# Patient Record
Sex: Female | Born: 1975 | Race: Black or African American | Hispanic: No | Marital: Married | Smoking: Never smoker
Health system: Southern US, Community
[De-identification: ages and names within clinical notes are randomized; demographics above are authoritative.]

## PROBLEM LIST (undated history)

## (undated) DIAGNOSIS — D573 Sickle-cell trait: Secondary | ICD-10-CM

## (undated) DIAGNOSIS — T7840XA Allergy, unspecified, initial encounter: Secondary | ICD-10-CM

## (undated) HISTORY — DX: Allergy, unspecified, initial encounter: T78.40XA

---

## 2001-05-18 ENCOUNTER — Inpatient Hospital Stay (HOSPITAL_COMMUNITY): Admission: AD | Admit: 2001-05-18 | Discharge: 2001-05-18 | Payer: Self-pay | Admitting: Obstetrics

## 2002-03-01 LAB — HM PAP SMEAR: HM Pap smear: NEGATIVE

## 2009-08-01 ENCOUNTER — Ambulatory Visit: Payer: Self-pay | Admitting: Family Medicine

## 2009-09-27 ENCOUNTER — Ambulatory Visit: Payer: Self-pay | Admitting: Obstetrics & Gynecology

## 2009-09-27 ENCOUNTER — Encounter: Admission: RE | Admit: 2009-09-27 | Discharge: 2009-09-27 | Payer: Self-pay | Admitting: Obstetrics & Gynecology

## 2011-01-15 NOTE — Assessment & Plan Note (Signed)
Jamie Lawson, WEYENBERG NO.:  [id number]   MEDICAL RECORD NO.:  [id number]          PATIENT TYPE:  POB   LOCATION:  [facility] at [place]         FACILITY:  [facility]   PHYSICIAN:  Elsie Lincoln, MD      DATE OF BIRTH:  06/20/1976   DATE OF SERVICE:                                  CLINIC NOTE   The patient is a 35 year old G4, P4 female who presents for a 19-month  history of pain in the left breast.  She has been breastfeeding her baby  for 15 months.  She has breastfed all of her kids for 2 years.  She is  worried that she has cancer.  She denies any fever or redness of the  left breast.  The baby goes to breasts approximately 5 times a day and  feeds well.  The patient denies any family history of breast cancer.   PAST MEDICAL HISTORY:  Denies all medical problems.   PAST SURGICAL HISTORY:  C-section x4.   GYNECOLOGIC HISTORY:  No history of abnormal Pap smear, has had a tubal  ligation.  No history of ovarian cysts, fibroid tumors, sexually  transmitted diseases or endometriosis.   MEDICATIONS:  None.   ALLERGIES:  None.  Latex allergy none.   SOCIAL HISTORY:  She is a Conservation officer, nature.  She is from [place].  She does not  smoke, drink or do illegal drugs.   FAMILY HISTORY:  Denies all familial cancers.  No diabetes, heart  disease, heart attack, high blood pressure, blood clots in legs or  lungs.   REVIEW OF SYSTEMS:  Positive for weakness, fatigue, anxiety, shortness  of breath and leg cramps.   PHYSICAL EXAMINATION:  VITAL SIGNS:  Pulse 72, blood pressure 106/61,  weight 178, height 66 inches.  GENERAL:  Well nourished, well developed, no apparent distress.  HEENT:  Normocephalic, atraumatic.  NEUROLOGIC:  Normal gait.  BREASTS:  Left breast greater than right breast.  The patient states  this has been this way her whole life.  No changes in the way the breast  hangs.  No skin changes.  Left breast tender in the nipple with deep  palpation.  No milk  expressed from either breast.  The breasts are full  and lumpy, bumpy, difficult to examine, but there are no discrete fixed  masses, no lymphadenopathy.   ASSESSMENT AND PLAN:  A 35 year old female with breast pain behind the  left nipple for 10 months.  1. Diagnostic mammogram and ultrasound.  2. The patient needs yearly exam and we will obtain records from      [facility] from her last      delivery.  3. Return to clinic in 1-2 weeks for test results and yearly exam.           ______________________________  Elsie Lincoln, MD     KL/MEDQ  D:  09/27/2009  T:  09/28/2009  Job:  [id number]

## 2017-11-27 ENCOUNTER — Encounter: Payer: Self-pay | Admitting: Emergency Medicine

## 2017-11-27 ENCOUNTER — Emergency Department
Admission: EM | Admit: 2017-11-27 | Discharge: 2017-11-27 | Disposition: A | Discharge status: Home / Self Care | Payer: 59 | Source: Home / Self Care | Attending: Emergency Medicine | Admitting: Emergency Medicine

## 2017-11-27 ENCOUNTER — Other Ambulatory Visit: Payer: Self-pay

## 2017-11-27 DIAGNOSIS — R5383 Other fatigue: Secondary | ICD-10-CM | POA: Diagnosis not present

## 2017-11-27 DIAGNOSIS — J029 Acute pharyngitis, unspecified: Secondary | ICD-10-CM | POA: Diagnosis not present

## 2017-11-27 DIAGNOSIS — R0683 Snoring: Secondary | ICD-10-CM

## 2017-11-27 DIAGNOSIS — R609 Edema, unspecified: Secondary | ICD-10-CM | POA: Diagnosis not present

## 2017-11-27 DIAGNOSIS — D573 Sickle-cell trait: Secondary | ICD-10-CM | POA: Diagnosis not present

## 2017-11-27 HISTORY — DX: Sickle-cell trait: D57.3

## 2017-11-27 LAB — POCT CBC W AUTO DIFF (K'VILLE URGENT CARE)

## 2017-11-27 LAB — POCT RAPID STREP A (OFFICE): Rapid Strep A Screen: NEGATIVE

## 2017-11-27 LAB — POCT FASTING CBG KUC MANUAL ENTRY: POCT GLUCOSE (MANUAL ENTRY) KUC: 95 mg/dL (ref 70–99)

## 2017-11-27 NOTE — ED Provider Notes (Signed)
[facility]    CSN: [id number] Arrival date & time: 11/27/17  1909     History   Chief Complaint Chief Complaint  Patient presents with  . Headache  . Sore Throat  . Leg Swelling    feet/ ankles    HPI Jamie Lawson is a 42 y.o. female.  Patient states she does not feel well today.  She has a headache, sore throat, and feeling when she is up that she might pass out.  Today she has had episodes where she feels like she would just fall asleep.  She has a history of being anemic secondary to sickle trait.  She has not had any recent blood work.  She is currently on her menses.  There is a family history of diabetes and she is concerned about this.  Headache  Associated symptoms: no fatigue and no fever   Sore Throat  Associated symptoms include headaches.    Past Medical History:  Diagnosis Date  . Sickle cell trait (HCC)     There are no active problems to display for this patient.   Past Surgical History:  Procedure Laterality Date  . CESAREAN SECTION WITH BILATERAL TUBAL LIGATION      OB History   None      Home Medications    Prior to Admission medications   Not on File    Family History No family history on file.  Social History Social History   Tobacco Use  . Smoking status: Never Smoker  . Smokeless tobacco: Never Used  Substance Use Topics  . Alcohol use: Never    Frequency: Never  . Drug use: Not on file     Allergies   Patient has no known allergies.   Review of Systems Review of Systems  Constitutional: Negative for chills, fatigue and fever.  HENT:       She has a history of thyroid problems.  Eyes: Negative.   Respiratory: Negative.   Cardiovascular: Negative.   Gastrointestinal: Negative.   Musculoskeletal: Negative.   Skin: Negative.   Neurological: Positive for headaches.       She has significant difficulty with snoring and according to the husband wakes up at night gasping.  Hematological:   She has a history of sickle trait.     Physical Exam Triage Vital Signs ED Triage Vitals [11/27/17 1944]  Enc Vitals Group     BP 103/65     Pulse Rate 77     Resp 18     Temp 97.9 F (36.6 C)     Temp Source Oral     SpO2      Weight 200 lb (90.7 kg)     Height 5\' 9"  (1.753 m)     Head Circumference      Peak Flow      Pain Score 2     Pain Loc      Pain Edu?      Excl. in GC?    Orthostatic VS for the past 24 hrs:  BP- Lying Pulse- Lying BP- Sitting Pulse- Sitting BP- Standing at 0 minutes Pulse- Standing at 0 minutes  11/27/17 2006 113/77 72 103/72 72 108/73 72    Updated Vital Signs BP 103/65 (BP Location: Right Arm)   Pulse 77   Temp 97.9 F (36.6 C) (Oral)   Resp 18   Ht 5\' 9"  (1.753 m)   Wt 200 lb (90.7 kg)   LMP 11/25/2017 (Exact Date)  BMI 29.53 kg/m   Visual Acuity Right Eye Distance:   Left Eye Distance:   Bilateral Distance:    Right Eye Near:   Left Eye Near:    Bilateral Near:     Physical Exam  Constitutional: She is oriented to person, place, and time. She appears well-developed and well-nourished.  She does appear to be pale.  HENT:  Head: Normocephalic and atraumatic.  Mouth/Throat: Oropharynx is clear and moist.  Eyes: Pupils are equal, round, and reactive to light. EOM are normal.  Neck: Normal range of motion. Neck supple.  Cardiovascular: Normal rate and regular rhythm.  Pulmonary/Chest: Effort normal.  Abdominal: Soft. Bowel sounds are normal.  Musculoskeletal:  There is 1+ swelling of the lower extremities.  There is no calf tenderness.  There is a negative Homans sign  Neurological: She is alert and oriented to person, place, and time.  Skin: Skin is warm and dry.     UC Treatments / Results  Labs (all labs ordered are listed, but only abnormal results are displayed) Labs Reviewed  STREP A DNA PROBE  COMPLETE METABOLIC PANEL WITH GFR  TSH  POCT RAPID STREP A (OFFICE)  POCT CBC W AUTO DIFF ([facility])    POCT FASTING CBG [facility] MANUAL ENTRY    EKG None Radiology No results found.  Procedures Procedures (including critical care time)  Medications Ordered in UC Medications - No data to display   Initial Impression / Assessment and Plan / UC Course  I have reviewed the triage vital signs and the nursing notes.  Pertinent labs & imaging results that were available during my care of the patient were reviewed by me and considered in my medical decision making (see chart for details). Patient has a mild anemia.  She is currently on her period and this may make her feel somewhat weak.  I am very concerned she has sleep apnea.  According to her husband she has significant snoring and wakes up at night gasping.  She has blamed her fatigue during the day on having 7 children at home and working full-time.  She is agreeable to go to the family medicine office next door and get established.  I told her I would call her when I get back her kidney function and thyroid test.      Final Clinical Impressions(s) / UC Diagnoses   Final diagnoses:  Fatigue, unspecified type  Sore throat  Snoring  Edema, unspecified type  Sickle-cell trait [facility])    ED Discharge Orders    None     Please make an appointment to be seen at the family medicine clinic. Do not go to work tomorrow. Please be seen and get a sleep study scheduled. Try support stockings for your swelling. I will call you with results of your blood work.  Controlled Substance Prescriptions [place] Controlled Substance Registry consulted? Not Applicable   Collene Gobble, MD 11/27/17 [phone]

## 2017-11-27 NOTE — ED Triage Notes (Signed)
Had headache yesterday; today sore throat, light headed, tired.

## 2017-11-27 NOTE — Discharge Instructions (Addendum)
Please make an appointment to be seen at the family medicine clinic. Do not go to work tomorrow. Please be seen and get a sleep study scheduled. Try support stockings for your swelling. I will call you with results of your blood work.

## 2017-11-28 ENCOUNTER — Telehealth: Payer: Self-pay | Admitting: *Deleted

## 2017-11-28 LAB — TEST AUTHORIZATION

## 2017-11-28 LAB — COMPLETE METABOLIC PANEL WITH GFR
AG RATIO: 1.6 (calc) (ref 1.0–2.5)
ALT: 16 U/L (ref 6–29)
AST: 14 U/L (ref 10–30)
Albumin: 4.2 g/dL (ref 3.6–5.1)
Alkaline phosphatase (APISO): 56 U/L (ref 33–115)
BILIRUBIN TOTAL: 0.2 mg/dL (ref 0.2–1.2)
BUN: 17 mg/dL (ref 7–25)
CHLORIDE: 105 mmol/L (ref 98–110)
CO2: 26 mmol/L (ref 20–32)
Calcium: 9.5 mg/dL (ref 8.6–10.2)
Creat: 0.83 mg/dL (ref 0.50–1.10)
GFR, Est African American: 101 mL/min/{1.73_m2} (ref >=60)
GFR, Est Non African American: 87 mL/min/{1.73_m2} (ref >=60)
Globulin: 2.6 g/dL (calc) (ref 1.9–3.7)
Glucose, Bld: 88 mg/dL (ref 65–99)
POTASSIUM: 4.6 mmol/L (ref 3.5–5.3)
SODIUM: 138 mmol/L (ref 135–146)
Total Protein: 6.8 g/dL (ref 6.1–8.1)

## 2017-11-28 LAB — TIQ-NTM

## 2017-11-28 LAB — TSH: TSH: 1.33 mIU/L

## 2017-11-28 LAB — STREP A DNA PROBE: GROUP A STREP PROBE: NOT DETECTED

## 2017-11-28 NOTE — Telephone Encounter (Signed)
Callback: Patient given WNL labs, Encouraged to establish with a PCP.

## 2017-12-25 LAB — HM MAMMOGRAPHY

## 2018-01-02 ENCOUNTER — Encounter: Payer: 59 | Admitting: Advanced Practice Midwife

## 2018-01-02 ENCOUNTER — Ambulatory Visit: Payer: 59 | Admitting: Physician Assistant

## 2018-01-16 ENCOUNTER — Encounter: Payer: 59 | Admitting: Advanced Practice Midwife

## 2018-01-16 ENCOUNTER — Encounter: Payer: Self-pay | Admitting: Physician Assistant

## 2018-01-16 ENCOUNTER — Ambulatory Visit (INDEPENDENT_AMBULATORY_CARE_PROVIDER_SITE_OTHER): Payer: 59 | Admitting: Physician Assistant

## 2018-01-16 VITALS — BP 118/78 | HR 78 | Wt 204.0 lb

## 2018-01-16 DIAGNOSIS — Z7689 Persons encountering health services in other specified circumstances: Secondary | ICD-10-CM | POA: Diagnosis not present

## 2018-01-16 DIAGNOSIS — D508 Other iron deficiency anemias: Secondary | ICD-10-CM

## 2018-01-16 DIAGNOSIS — G8929 Other chronic pain: Secondary | ICD-10-CM | POA: Diagnosis not present

## 2018-01-16 DIAGNOSIS — M546 Pain in thoracic spine: Secondary | ICD-10-CM

## 2018-01-16 MED ORDER — FERROUS SULFATE 325 (65 FE) MG PO TBEC: DELAYED_RELEASE_TABLET | ORAL | 11 refills | Status: DC

## 2018-01-16 NOTE — Progress Notes (Signed)
HPI:                                                                Jamie Lawson is a 42 y.o. female who presents to [facility]: Primary Care Sports Medicine today to establish care  Current concerns: back pain  Mid-upper back pain x 3-4 years. Pain is moderate, waxing and waning. 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 She occasionally takes Ibuprofen 600 mg when it is severe, which helps. Denies constitutional symptoms, paresthesias, numbness, weakness, bowel or bladder dysfunction.  Depression screen [facility] 2/9 01/16/2018  Decreased Interest 0  Down, Depressed, Hopeless 0  PHQ - 2 Score 0    No flowsheet data found.    Past Medical History:  Diagnosis Date  . Sickle cell trait [facility])    Past Surgical History:  Procedure Laterality Date  . CESAREAN SECTION     x4  . CESAREAN SECTION WITH BILATERAL TUBAL LIGATION     Social History   Tobacco Use  . Smoking status: Never Smoker  . Smokeless tobacco: Never Used  Substance Use Topics  . Alcohol use: Never    Frequency: Never   family history is not on file.    ROS: Review of Systems  HENT: Positive for sinus pain.   Respiratory: Positive for cough (non-productive).   Neurological: Positive for headaches.     Medications: Current Outpatient Medications  Medication Sig Dispense Refill  . benzonatate (TESSALON) 100 MG capsule Take by mouth.    . fexofenadine (ALLEGRA) 180 MG tablet Take by mouth.     No current facility-administered medications for this visit.    No Known Allergies     Objective:  BP 118/78   Pulse 78   Wt 204 lb (92.5 kg)   BMI 30.13 kg/m  Gen:  alert, not ill-appearing, no distress, appropriate for age, obese female HEENT: head normocephalic without obvious abnormality, conjunctiva and cornea clear, trachea midline Pulm: Normal work of breathing, normal phonation Neuro: alert and oriented x 3, no tremor, DTR's intact,  sensation grossly intact MSK: extremities atraumatic, normal gait and station Back: atraumatic, no midline-tenderness, posture is slightly kyphotic in the cervico-thoracic area, paraspinal muscle spasm of the trapezial area Skin: intact, no rashes on exposed skin, no jaundice, no cyanosis Psych: well-groomed, cooperative, good eye contact, euthymic mood, affect mood-congruent, speech is articulate, and thought processes clear and goal-directed    No results found for this or any previous visit (from the past 72 hour(s)). No results found.    Assessment and Plan: 42 y.o. female with   Encounter to establish care  Other iron deficiency anemia - Plan: ferrous sulfate 325 (65 FE) MG EC tablet  Chronic bilateral thoracic back pain  - Personally reviewed PMH, PSH, PFH, medications, allergies, HM - Age-appropriate cancer screening: Mammogram UTD 12/25/17, Pap UTD per patient requesting records - Influenza n/a - Tdap declined - PHQ2 negative  Chronic thoracic back pain - no red flag symptoms, benign exam - home spine rehab exercises and working on posture - Aleve 2 caps bid prn or Ibuprofen 800 mg Q8H prn - TENS unit, Ice/Heat, magnesium oil nightly - follow-up if no improvement in 4-6 weeks. Will obtain plain films  and refer to formal PT and Sports Medicine   Patient education and anticipatory guidance given Patient agrees with treatment plan Follow-up as needed if symptoms worsen or fail to improve  Levonne Hubert PA-C

## 2018-01-16 NOTE — Patient Instructions (Signed)
For back pain: - You can take Aleve 2 capsules twice a day as needed for pain or Ibuprofen 800 mg every 8 hours as needed - apply Magnesium oil to upper back and shoulders nightly - use TENS unit for electrosimulation of the muscles - ice or heat x 20 minutes 3-4 times per day  - work on posture - home rehab exercises to strengthen your core, chest and back   Chronic Back Pain When back pain lasts longer than 3 months, it is called chronic back pain.The cause of your back pain may not be known. Some common causes include:  Wear and tear (degenerative disease) of the bones, ligaments, or disks in your back.  Inflammation and stiffness in your back (arthritis).  People who have chronic back pain often go through certain periods in which the pain is more intense (flare-ups). Many people can learn to manage the pain with home care. Follow these instructions at home: Pay attention to any changes in your symptoms. Take these actions to help with your pain: Activity  Avoid bending and activities that make the problem worse.  Do not sit or stand in one place for long periods of time.  Take brief periods of rest throughout the day. This will reduce your pain. Resting in a lying or standing position is usually better than sitting to rest.  When you are resting for longer periods, mix in some mild activity or stretching between periods of rest. This will help to prevent stiffness and pain.  Get regular exercise. Ask your health care provider what activities are safe for you.  Do not lift anything that is heavier than 10 lb (4.5 kg). Always use proper lifting technique, which includes: ? Bending your knees. ? Keeping the load close to your body. ? Avoiding twisting. Managing pain  If directed, apply ice to the painful area. Your health care provider may recommend applying ice during the first 24-48 hours after a flare-up begins. ? Put ice in a plastic bag. ? Place a towel between your  skin and the bag. ? Leave the ice on for 20 minutes, 2-3 times per day.  After icing, apply heat to the affected area as often as told by your health care provider. Use the heat source that your health care provider recommends, such as a moist heat pack or a heating pad. ? Place a towel between your skin and the heat source. ? Leave the heat on for 20-30 minutes. ? Remove the heat if your skin turns bright red. This is especially important if you are unable to feel pain, heat, or cold. You may have a greater risk of getting burned.  Try soaking in a warm tub.  Take over-the-counter and prescription medicines only as told by your health care provider.  Keep all follow-up visits as told by your health care provider. This is important. Contact a health care provider if:  You have pain that is not relieved with rest or medicine. Get help right away if:  You have weakness or numbness in one or both of your legs or feet.  You have trouble controlling your bladder or your bowels.  You have nausea or vomiting.  You have pain in your abdomen.  You have shortness of breath or you faint. This information is not intended to replace advice given to you by your health care provider. Make sure you discuss any questions you have with your health care provider. Document Released: 09/26/2004 Document Revised: 12/28/2015 Document Reviewed:  02/06/2015 Elsevier Interactive Patient Education  [employer].

## 2018-01-19 ENCOUNTER — Encounter: Payer: Self-pay | Admitting: Physician Assistant

## 2018-01-26 ENCOUNTER — Encounter: Payer: Self-pay | Admitting: Physician Assistant

## 2018-01-26 DIAGNOSIS — G8929 Other chronic pain: Secondary | ICD-10-CM | POA: Insufficient documentation

## 2018-01-26 DIAGNOSIS — M546 Pain in thoracic spine: Secondary | ICD-10-CM

## 2018-01-26 DIAGNOSIS — D649 Anemia, unspecified: Secondary | ICD-10-CM | POA: Insufficient documentation

## 2018-01-28 ENCOUNTER — Encounter: Payer: Self-pay | Admitting: Physician Assistant

## 2018-01-30 ENCOUNTER — Ambulatory Visit: Payer: 59 | Admitting: Advanced Practice Midwife

## 2018-01-30 DIAGNOSIS — Z01419 Encounter for gynecological examination (general) (routine) without abnormal findings: Secondary | ICD-10-CM

## 2018-05-11 ENCOUNTER — Encounter: Payer: Self-pay | Admitting: Emergency Medicine

## 2018-05-11 ENCOUNTER — Other Ambulatory Visit: Payer: Self-pay

## 2018-05-11 ENCOUNTER — Emergency Department
Admission: EM | Admit: 2018-05-11 | Discharge: 2018-05-11 | Disposition: A | Discharge status: Home / Self Care | Payer: 59 | Source: Home / Self Care | Attending: Family Medicine | Admitting: Family Medicine

## 2018-05-11 ENCOUNTER — Emergency Department (INDEPENDENT_AMBULATORY_CARE_PROVIDER_SITE_OTHER): Payer: 59

## 2018-05-11 DIAGNOSIS — R0602 Shortness of breath: Secondary | ICD-10-CM | POA: Diagnosis not present

## 2018-05-11 DIAGNOSIS — J302 Other seasonal allergic rhinitis: Secondary | ICD-10-CM

## 2018-05-11 DIAGNOSIS — R05 Cough: Secondary | ICD-10-CM

## 2018-05-11 DIAGNOSIS — J9801 Acute bronchospasm: Secondary | ICD-10-CM | POA: Diagnosis not present

## 2018-05-11 DIAGNOSIS — R079 Chest pain, unspecified: Secondary | ICD-10-CM

## 2018-05-11 LAB — POCT CBC W AUTO DIFF (K'VILLE URGENT CARE)

## 2018-05-11 MED ORDER — MONTELUKAST SODIUM 10 MG PO TABS: ORAL_TABLET | ORAL | 1 refills | Status: DC

## 2018-05-11 MED ORDER — PREDNISONE 20 MG PO TABS: ORAL_TABLET | ORAL | 0 refills | Status: DC

## 2018-05-11 NOTE — Discharge Instructions (Signed)
May discontinue Allegra for now.

## 2018-05-11 NOTE — ED Provider Notes (Signed)
[facility]    CSN: [id number] Arrival date & time: 05/11/18  1812     History   Chief Complaint Chief Complaint  Patient presents with  . Cough    HPI Jamie Lawson is a 42 y.o. female.   Patient complains of persistent cough, anterior chest tightness, and shortness of breath for one month.  No wheezing, pleuritic pain, sore throat, sinus congestion, or fevers, chills, and sweats.  She feels well otherwise. No indigestion or heartburn. She had had a similar cough for several weeks earlier this year.  She visited the [facility] ED where a chest X-ray was negative, and her symptoms were thought to be allergy related.  She was started on Allegra with resolution of her symptoms.  The history is provided by the patient and the spouse.    Past Medical History:  Diagnosis Date  . Sickle cell trait [facility])     Patient Active Problem List   Diagnosis Date Noted  . Absolute anemia 01/26/2018  . Chronic bilateral thoracic back pain 01/26/2018    Past Surgical History:  Procedure Laterality Date  . CESAREAN SECTION     x4  . CESAREAN SECTION WITH BILATERAL TUBAL LIGATION      OB History    Gravida  4   Para  4   Term      Preterm      AB      Living        SAB      TAB      Ectopic      Multiple      Live Births               Home Medications    Prior to Admission medications   Medication Sig Start Date End Date Taking? Authorizing Provider  fexofenadine (ALLEGRA) 180 MG tablet Take by mouth. 01/15/18   [provider]  montelukast (SINGULAIR) 10 MG tablet Take one tab by mouth at bedtime 05/11/18   Lattie Haw, MD  predniSONE (DELTASONE) 20 MG tablet Take one tab by mouth twice daily for 4 days, then one daily.. Take with food. 05/11/18   Lattie Haw, MD    Family History No family history on file.  Social History Social History   Tobacco Use  . Smoking status: Never Smoker  .  Smokeless tobacco: Never Used  Substance Use Topics  . Alcohol use: Never    Frequency: Never  . Drug use: Not on file     Allergies   Patient has no known allergies.   Review of Systems Review of Systems ? sore throat + cough No pleuritic pain, but feels tight in anterior chest No wheezing No nasal congestion + post-nasal drainage No sinus pain/pressure No itchy/red eyes No earache No hemoptysis + SOB No fever/chills No nausea No vomiting No abdominal pain No diarrhea No urinary symptoms No skin rash + fatigue No myalgias No headache Used OTC meds without relief   Physical Exam Triage Vital Signs ED Triage Vitals  Enc Vitals Group     BP 05/11/18 1907 122/86     Pulse Rate 05/11/18 1907 79     Resp --      Temp 05/11/18 1907 (!) 97.5 F (36.4 C)     Temp Source 05/11/18 1907 Oral     SpO2 05/11/18 1907 99 %     Weight 05/11/18 1915 196 lb (88.9 kg)     Height  05/11/18 1915 5\' 9"  (1.753 m)     Head Circumference --      Peak Flow --      Pain Score 05/11/18 1908 1     Pain Loc --      Pain Edu? --      Excl. in GC? --    No data found.  Updated Vital Signs BP 122/86 (BP Location: Right Arm)   Pulse 79   Temp (!) 97.5 F (36.4 C) (Oral)   Ht 5\' 9"  (1.753 m)   Wt 88.9 kg   LMP 05/06/2018   SpO2 99%   BMI 28.94 kg/m   Visual Acuity Right Eye Distance:   Left Eye Distance:   Bilateral Distance:    Right Eye Near:   Left Eye Near:    Bilateral Near:     Physical Exam Nursing notes and Vital Signs reviewed. Appearance:  Patient appears stated age, and in no acute distress Eyes:  Pupils are equal, round, and reactive to light and accomodation.  Extraocular movement is intact.  Conjunctivae are not inflamed  Ears:  Canals normal.  Tympanic membranes normal.  Nose:  Mildly congested turbinates.  No sinus tenderness.   Pharynx:  Normal Neck:  Supple.  No adenopathy  Lungs:  Clear to auscultation.  Breath sounds are equal.  Moving air  well. Heart:  Regular rate and rhythm without murmurs, rubs, or gallops.  Abdomen:  Nontender without masses or hepatosplenomegaly.  Bowel sounds are present.  No CVA or flank tenderness.  Extremities:  No edema.  No lower leg tenderness to palpation. Skin:  No rash present.    UC Treatments / Results  Labs (all labs ordered are listed, but only abnormal results are displayed) Labs Reviewed  POCT CBC W AUTO DIFF ([facility]):  WBC 6.6; LY 39.7; MO 5.9; GR 54.4; Hgb 12.5; Platelets 322     EKG  Rate:  70 BPM PR:  174 msec QT:  416 msec QTcH:  449 msec QRSD:  84 msec QRS axis:  -17 degrees Interpretation:   normal sinus rhythm;  No acute changes   Radiology Dg Chest 2 View  Result Date: 05/11/2018 CLINICAL DATA:  Chest pain and short of breath for 1 day. Cough for 1 month. EXAM: CHEST - 2 VIEW COMPARISON:  None. FINDINGS: Normal heart size. Lungs clear. No pneumothorax. No pleural effusion. IMPRESSION: No active cardiopulmonary disease. Electronically Signed   By: Jolaine Click M.D.   On: 05/11/2018 20:06    Procedures Procedures (including critical care time)  Medications Ordered in UC Medications - No data to display  Initial Impression / Assessment and Plan / UC Course  I have reviewed the triage vital signs and the nursing notes.  Pertinent labs & imaging results that were available during my care of the patient were reviewed by me and considered in my medical decision making (see chart for details).    Negative chest X-ray and normal CBC reassuring.  EKG shows no suspicious changes. Suspect seasonal rhinitis and mild reactive airways disease. Begin prednisone burst/taper. Trial of Singulair. Followup with Family Doctor in about 10 days.   Final Clinical Impressions(s) / UC Diagnoses   Final diagnoses:  Bronchospasm  Seasonal allergies     Discharge Instructions     May discontinue Allegra for now.    ED Prescriptions    Medication Sig Dispense  Auth. Provider   montelukast (SINGULAIR) 10 MG tablet Take one tab by mouth at bedtime 30  tablet Lattie Haw, MD   predniSONE (DELTASONE) 20 MG tablet Take one tab by mouth twice daily for 4 days, then one daily.. Take with food. 12 tablet Lattie Haw, MD         Lattie Haw, MD 05/12/18 1048

## 2018-05-11 NOTE — ED Triage Notes (Signed)
Cough x 1 month, can't take a deep breath, epigastric pain and pressure last night when she laid down to go to sleep, no appetite.

## 2018-05-13 ENCOUNTER — Other Ambulatory Visit: Payer: Self-pay

## 2018-08-11 ENCOUNTER — Emergency Department (INDEPENDENT_AMBULATORY_CARE_PROVIDER_SITE_OTHER): Payer: 59

## 2018-08-11 ENCOUNTER — Other Ambulatory Visit: Payer: Self-pay

## 2018-08-11 ENCOUNTER — Emergency Department (INDEPENDENT_AMBULATORY_CARE_PROVIDER_SITE_OTHER)
Admission: EM | Admit: 2018-08-11 | Discharge: 2018-08-11 | Disposition: A | Discharge status: Home / Self Care | Payer: 59 | Source: Home / Self Care

## 2018-08-11 DIAGNOSIS — R1012 Left upper quadrant pain: Secondary | ICD-10-CM

## 2018-08-11 DIAGNOSIS — R1013 Epigastric pain: Secondary | ICD-10-CM

## 2018-08-11 LAB — POCT CBC W AUTO DIFF (K'VILLE URGENT CARE)

## 2018-08-11 MED ORDER — LIDOCAINE VISCOUS HCL 2 % MT SOLN
15.0000 mL | Freq: Once | OROMUCOSAL | Status: AC
  Administered 2018-08-11: 15 mL via ORAL

## 2018-08-11 MED ORDER — ALUM & MAG HYDROXIDE-SIMETH 200-200-20 MG/5ML PO SUSP
30.0000 mL | Freq: Once | ORAL | Status: AC
  Administered 2018-08-11: 30 mL via ORAL

## 2018-08-11 MED ORDER — OMEPRAZOLE 20 MG PO CPDR: 20.0000 mg | DELAYED_RELEASE_CAPSULE | Freq: Two times a day (BID) | ORAL | 0 refills | Status: DC

## 2018-08-11 NOTE — Discharge Instructions (Addendum)
°  Your pain is likely from a stomach ulcer. It is recommended that you start taking the medication that was prescribed today.  Please call to schedule a follow up exam with your family doctor later this week or early next week for recheck of symptoms.  Call 911 or go to the hospital if symptoms worsening.

## 2018-08-11 NOTE — ED Triage Notes (Signed)
Pt stated that 5 days ago she could not sleep, and she got up and took ibuprofen.  Since then, she has had ULQ pain that radiates to the mid abdomen and up into the lower sternum.

## 2018-08-11 NOTE — ED Provider Notes (Signed)
[facility]    CSN: [id number] Arrival date & time: 08/11/18  1036     History   Chief Complaint Chief Complaint  Patient presents with  . Abdominal Pain    HPI Jamie Lawson is a 42 y.o. female.   HPI Jamie Lawson is a 41 y.o. female presenting to UC with c/o LUQ abdominal pain.  She reports having a hard time sleeping 5 days ago due to drinking coffee too late in the day. She took two ibuprofen PM. She has had pain since then, aching and sore. Pain radiates to epigastric area.  Denies chest pain, SOB, nausea or vomiting.  She also reports being constipated, last BM was about 2 days ago, she has been trying OTC Dulcolax and pepto for possible "gas" but no relief.  Drinking water helps some.   Past Medical History:  Diagnosis Date  . Sickle cell trait [facility])     Patient Active Problem List   Diagnosis Date Noted  . Absolute anemia 01/26/2018  . Chronic bilateral thoracic back pain 01/26/2018    Past Surgical History:  Procedure Laterality Date  . CESAREAN SECTION     x4  . CESAREAN SECTION WITH BILATERAL TUBAL LIGATION      OB History    Gravida  4   Para  4   Term      Preterm      AB      Living        SAB      TAB      Ectopic      Multiple      Live Births               Home Medications    Prior to Admission medications   Medication Sig Start Date End Date Taking? Authorizing Provider  fexofenadine (ALLEGRA) 180 MG tablet Take by mouth. 01/15/18   [provider]  montelukast (SINGULAIR) 10 MG tablet Take one tab by mouth at bedtime 05/11/18   Lattie Haw, MD  omeprazole (PRILOSEC) 20 MG capsule Take 1 capsule (20 mg total) by mouth 2 (two) times daily before a meal. 08/11/18   Zaniel Marineau, Vangie Bicker, PA-C  predniSONE (DELTASONE) 20 MG tablet Take one tab by mouth twice daily for 4 days, then one daily.. Take with food. 05/11/18   Lattie Haw, MD    Family History History reviewed. No pertinent  family history.  Social History Social History   Tobacco Use  . Smoking status: Never Smoker  . Smokeless tobacco: Never Used  Substance Use Topics  . Alcohol use: Never    Frequency: Never  . Drug use: Not on file     Allergies   Patient has no known allergies.   Review of Systems Review of Systems  Constitutional: Negative for chills and fever.  Respiratory: Negative for chest tightness and shortness of breath.   Cardiovascular: Negative for chest pain and palpitations.  Gastrointestinal: Positive for abdominal pain and constipation. Negative for blood in stool, diarrhea, nausea and vomiting.     Physical Exam Triage Vital Signs ED Triage Vitals  Enc Vitals Group     BP 08/11/18 1053 129/85     Pulse Rate 08/11/18 1053 81     Resp 08/11/18 1053 18     Temp 08/11/18 1053 98.2 F (36.8 C)     Temp Source 08/11/18 1053 Oral     SpO2 08/11/18 1053 99 %  Weight 08/11/18 1054 207 lb (93.9 kg)     Height 08/11/18 1054 5\' 8"  (1.727 m)     Head Circumference --      Peak Flow --      Pain Score 08/11/18 1054 10     Pain Loc --      Pain Edu? --      Excl. in GC? --    No data found.  Updated Vital Signs BP 129/85 (BP Location: Right Arm)   Pulse 81   Temp 98.2 F (36.8 C) (Oral)   Resp 18   Ht 5\' 8"  (1.727 m)   Wt 207 lb (93.9 kg)   SpO2 99%   BMI 31.47 kg/m   Visual Acuity Right Eye Distance:   Left Eye Distance:   Bilateral Distance:    Right Eye Near:   Left Eye Near:    Bilateral Near:     Physical Exam  Constitutional: She is oriented to person, place, and time. She appears well-developed and well-nourished.  Non-toxic appearance. She does not appear ill. No distress.  HENT:  Head: Normocephalic and atraumatic.  Eyes: EOM are normal.  Neck: Normal range of motion.  Cardiovascular: Normal rate and regular rhythm.  Pulmonary/Chest: Effort normal and breath sounds normal.  Abdominal: Soft. Normal appearance. There is tenderness in the left  upper quadrant. There is no rigidity, no rebound, no guarding and no CVA tenderness.  Musculoskeletal: Normal range of motion.  Neurological: She is alert and oriented to person, place, and time.  Skin: Skin is warm and dry.  Psychiatric: She has a normal mood and affect. Her behavior is normal.  Nursing note and vitals reviewed.    UC Treatments / Results  Labs (all labs ordered are listed, but only abnormal results are displayed) Labs Reviewed  COMPLETE METABOLIC PANEL WITH GFR  LIPASE  POCT CBC W AUTO DIFF ([facility])    EKG None  Radiology Dg Abdomen 1 View  Result Date: 08/11/2018 CLINICAL DATA:  Acute left upper quadrant abdominal pain. EXAM: ABDOMEN - 1 VIEW COMPARISON:  None. FINDINGS: The bowel gas pattern is normal. No radio-opaque calculi or other significant radiographic abnormality are seen. IMPRESSION: No evidence of bowel obstruction or ileus. Electronically Signed   By: Lupita Raider, M.D.   On: 08/11/2018 11:54    Procedures Procedures (including critical care time)  Medications Ordered in UC Medications  alum & mag hydroxide-simeth (MAALOX/MYLANTA) 200-200-20 MG/5ML suspension 30 mL (30 mLs Oral Given 08/11/18 1135)    And  lidocaine (XYLOCAINE) 2 % viscous mouth solution 15 mL (15 mLs Oral Given 08/11/18 1135)    Initial Impression / Assessment and Plan / UC Course  I have reviewed the triage vital signs and the nursing notes.  Pertinent labs & imaging results that were available during my care of the patient were reviewed by me and considered in my medical decision making (see chart for details).     KUB unremarkable CBC: WNL CMP and Lipase: pending Suspect gastric ulcer Encouraged to try trial of omeprazole F/u with PCP   Final Clinical Impressions(s) / UC Diagnoses   Final diagnoses:  LUQ abdominal pain  Abdominal pain, left upper quadrant  Abdominal pain, epigastric     Discharge Instructions      Your pain is likely  from a stomach ulcer. It is recommended that you start taking the medication that was prescribed today.  Please call to schedule a follow up exam with your family  doctor later this week or early next week for recheck of symptoms.  Call 911 or go to the hospital if symptoms worsening.     ED Prescriptions    Medication Sig Dispense Auth. Provider   omeprazole (PRILOSEC) 20 MG capsule Take 1 capsule (20 mg total) by mouth 2 (two) times daily before a meal. 60 capsule Lurene ShadowPhelps, Camari Wisham O, PA-C     Controlled Substance Prescriptions [place] Controlled Substance Registry consulted? Not Applicable   Rolla Platehelps, Ashelynn Marks O, PA-C 08/11/18 1353

## 2018-08-12 ENCOUNTER — Telehealth: Payer: Self-pay

## 2018-08-12 LAB — COMPLETE METABOLIC PANEL WITH GFR
AG Ratio: 1.7 (calc) (ref 1.0–2.5)
ALT: 14 U/L (ref 6–29)
AST: 14 U/L (ref 10–30)
Albumin: 4.6 g/dL (ref 3.6–5.1)
Alkaline phosphatase (APISO): 60 U/L (ref 33–115)
BUN: 13 mg/dL (ref 7–25)
CO2: 28 mmol/L (ref 20–32)
Calcium: 10.1 mg/dL (ref 8.6–10.2)
Chloride: 103 mmol/L (ref 98–110)
Creat: 0.71 mg/dL (ref 0.50–1.10)
GFR, Est African American: 122 mL/min/{1.73_m2} (ref >=60)
GFR, Est Non African American: 105 mL/min/{1.73_m2} (ref >=60)
Globulin: 2.7 g/dL (calc) (ref 1.9–3.7)
Glucose, Bld: 89 mg/dL (ref 65–99)
Potassium: 4.1 mmol/L (ref 3.5–5.3)
Sodium: 141 mmol/L (ref 135–146)
Total Bilirubin: 0.4 mg/dL (ref 0.2–1.2)
Total Protein: 7.3 g/dL (ref 6.1–8.1)

## 2018-08-12 LAB — LIPASE: Lipase: 14 U/L (ref 7–60)

## 2018-08-12 NOTE — Telephone Encounter (Signed)
Spoke with patient, doing better.  Will follow up with Jamie Lawson.

## 2018-08-20 ENCOUNTER — Ambulatory Visit: Payer: 59 | Admitting: Physician Assistant

## 2018-08-31 ENCOUNTER — Encounter: Payer: 59 | Admitting: Family Medicine

## 2018-11-20 ENCOUNTER — Ambulatory Visit (INDEPENDENT_AMBULATORY_CARE_PROVIDER_SITE_OTHER): Payer: 59 | Admitting: Physician Assistant

## 2018-11-20 ENCOUNTER — Other Ambulatory Visit: Payer: Self-pay

## 2018-11-20 ENCOUNTER — Encounter: Payer: Self-pay | Admitting: Physician Assistant

## 2018-11-20 VITALS — BP 120/79 | HR 81 | Wt 209.0 lb

## 2018-11-20 DIAGNOSIS — Z9109 Other allergy status, other than to drugs and biological substances: Secondary | ICD-10-CM | POA: Insufficient documentation

## 2018-11-20 DIAGNOSIS — M67431 Ganglion, right wrist: Secondary | ICD-10-CM | POA: Insufficient documentation

## 2018-11-20 DIAGNOSIS — Z91013 Allergy to seafood: Secondary | ICD-10-CM

## 2018-11-20 DIAGNOSIS — T628X1A Toxic effect of other specified noxious substances eaten as food, accidental (unintentional), initial encounter: Secondary | ICD-10-CM | POA: Diagnosis not present

## 2018-11-20 DIAGNOSIS — T781XXA Other adverse food reactions, not elsewhere classified, initial encounter: Secondary | ICD-10-CM

## 2018-11-20 MED ORDER — FAMOTIDINE 20 MG PO TABS: 20.0000 mg | ORAL_TABLET | Freq: Two times a day (BID) | ORAL | Status: DC

## 2018-11-20 MED ORDER — EPINEPHRINE 0.3 MG/0.3ML IJ SOAJ: 0.3000 mg | Freq: Once | INTRAMUSCULAR | 1 refills | Status: DC | PRN

## 2018-11-20 NOTE — Patient Instructions (Addendum)
Avoid all seafood and nut products (read labels for nut oils) until you have seen the Allergist Start Xyzal and Pepcid daily Continue Singulair Use Benadryl as needed for itching Use Epi-pen for signs and symptoms severe allergic reaction    Allergies, Adult An allergy is when your body's defense system (immune system) overreacts to an otherwise harmless substance (allergen) that you breathe in or eat or something that touches your skin. When you come into contact with something that you are allergic to, your immune system produces certain proteins (antibodies). These proteins cause cells to release chemicals (histamines) that trigger the symptoms of an allergic reaction. Allergies often affect the nasal passages (allergic rhinitis), eyes (allergic conjunctivitis), skin (atopic dermatitis), and stomach. Allergies can be mild or severe. Allergies cannot spread from person to person (are not contagious). They can develop at any age and may be outgrown. What increases the risk? You may be at greater risk of allergies if other people in your family have allergies. What are the signs or symptoms? Symptoms depend on what type of allergy you have. They may include:  Runny, stuffy nose.  Sneezing.  Itchy mouth, ears, or throat.  Postnasal drip.  Sore throat.  Itchy, red, watery, or puffy eyes.  Skin rash or hives.  Stomach pain.  Vomiting.  Diarrhea.  Bloating.  Wheezing or coughing. People with a severe allergy to food, medicine, or an insect bite may have a life-threatening allergic reaction (anaphylaxis). Symptoms of anaphylaxis include:  Hives.  Itching.  Flushed face.  Swollen lips, tongue, or mouth.  Tight or swollen throat.  Chest pain or tightness in the chest.  Trouble breathing or shortness of breath.  Rapid heartbeat.  Dizziness or fainting.  Vomiting.  Diarrhea.  Pain in the abdomen. How is this diagnosed? This condition is diagnosed based on:   Your symptoms.  Your family and medical history.  A physical exam. You may need to see a health care provider who specializes in treating allergies (allergist). You may also have tests, including:  Skin tests to see which allergens are causing your symptoms, such as: ? Skin prick test. In this test, your skin is pricked with a tiny needle and exposed to small amounts of possible allergens to see if your skin reacts. ? Intradermal skin test. In this test, a small amount of allergen is injected under your skin to see if your skin reacts. ? Patch test. In this test, a small amount of allergen is placed on your skin and then your skin is covered with a bandage. Your health care provider will check your skin after a couple of days to see if a rash has developed.  Blood tests.  Challenges tests. In this test, you inhale a small amount of allergen by mouth to see if you have an allergic reaction. You may also be asked to:  Keep a food diary. A food diary is a record of all the foods and drinks you have in a day and any symptoms you experience.  Practice an elimination diet. An elimination diet involves eliminating specific foods from your diet and then adding them back in one by one to find out if a certain food causes an allergic reaction. How is this treated? Treatment for allergies depends on your symptoms. Treatment may include:  Cold compresses to soothe itching and swelling.  Eye drops.  Nasal sprays.  Using a saline spray or container (neti pot) to flush out the nose (nasal irrigation). These methods can help  clear away mucus and keep the nasal passages moist.  Using a humidifier.  Oral antihistamines or other medicines to block allergic reaction and inflammation.  Skin creams to treat rashes or itching.  Diet changes to eliminate food allergy triggers.  Repeated exposure to tiny amounts of allergens to build up a tolerance and prevent future allergic reactions (immunotherapy).  These include: ? Allergy shots. ? Oral treatment. This involves taking small doses of an allergen under the tongue (sublingual immunotherapy).  Emergency epinephrine injection (auto-injector) in case of an allergic emergency. This is a self-injectable, pre-measured medicine that must be given within the first few minutes of a serious allergic reaction. Follow these instructions at home:         Avoid known allergens whenever possible.  If you suffer from airborne allergens, wash out your nose daily. You can do this with a saline spray or a neti pot to flush out your nose (nasal irrigation).  Take over-the-counter and prescription medicines only as told by your health care provider.  Keep all follow-up visits as told by your health care provider. This is important.  If you are at risk of a severe allergic reaction (anaphylaxis), keep your auto-injector with you at all times.  If you have ever had anaphylaxis, wear a medical alert bracelet or necklace that states you have a severe allergy. Contact a health care provider if:  Your symptoms do not improve with treatment. Get help right away if:  You have symptoms of anaphylaxis, such as: ? Swollen mouth, tongue, or throat. ? Pain or tightness in your chest. ? Trouble breathing or shortness of breath. ? Dizziness or fainting. ? Severe abdominal pain, vomiting, or diarrhea. This information is not intended to replace advice given to you by your health care provider. Make sure you discuss any questions you have with your health care provider. Document Released: 11/12/2002 Document Revised: 12/18/2016 Document Reviewed: 03/06/2016 Elsevier Interactive Patient Education  2019 [employer].

## 2018-11-20 NOTE — Progress Notes (Signed)
HPI:                                                                Jamie Lawson is a 43 y.o. female who presents to [facility]: Primary Care Sports Medicine today for allergies  Reports throat tightness and coughing with eating peanuts approx 3 weeks ago. It resolved with Benadryl. Since that time throat has felt "heavy." Denies facial/lip/tongue swelling, wheezing, chest tightness, or rash. Denies dysphagia or painful swallowing. Similar symptoms also occurred many years ago with eating shrimp. Reports daughter is allergic to peanuts.  She currently takes Singulair for allergic rhinitis. She discontinued Allegra because it "dried me out."  She also c/o of a recurrent ganglion cyst of her right wrist. Recently it has become painful. She reports this was injected approx 6 months ago.  Past Medical History:  Diagnosis Date  . Sickle cell trait [facility])    Past Surgical History:  Procedure Laterality Date  . CESAREAN SECTION     x4  . CESAREAN SECTION WITH BILATERAL TUBAL LIGATION     Social History   Tobacco Use  . Smoking status: Never Smoker  . Smokeless tobacco: Never Used  Substance Use Topics  . Alcohol use: Never    Frequency: Never   family history is not on file.    ROS: negative except as noted in the HPI  Medications: Current Outpatient Medications  Medication Sig Dispense Refill  . diphenhydrAMINE (BENADRYL) 50 MG capsule Take 50 mg by mouth every 6 (six) hours as needed for allergies.    Marland Kitchen levocetirizine (XYZAL) 5 MG tablet Take 5 mg by mouth every evening.    . montelukast (SINGULAIR) 10 MG tablet Take one tab by mouth at bedtime 30 tablet 1  . omeprazole (PRILOSEC) 20 MG capsule Take 1 capsule (20 mg total) by mouth 2 (two) times daily before a meal. 60 capsule 0   No current facility-administered medications for this visit.    No Known Allergies     Objective:  BP 120/79   Pulse 81   Wt 209 lb (94.8 kg)   SpO2 97%   BMI  31.78 kg/m  Gen:  alert, not ill-appearing, no distress, appropriate for age HEENT: head normocephalic without obvious abnormality, conjunctiva and cornea clear, oropharynx clear, tonsils grade 2+, uvula midline, neck supple, no cervical adenopathy, trachea midline Pulm: Normal work of breathing, normal phonation, clear to auscultation bilaterally, no wheezes, rales or rhonchi CV: Normal rate, regular rhythm, s1 and s2 distinct, no murmurs, clicks or rubs  Neuro: alert and oriented x 3, no tremor MSK: extremities atraumatic, normal gait and station Skin: intact, no rashes on exposed skin, no jaundice, no cyanosis    No results found for this or any previous visit (from the past 72 hour(s)). No results found.    Assessment and Plan: 43 y.o. female with   .Diagnoses and all orders for this visit:  Environmental allergies -     Ambulatory referral to Allergy  Allergic reaction to peanut -     EPINEPHrine 0.3 mg/0.3 mL IJ SOAJ injection; Inject 0.3 mLs (0.3 mg total) into the muscle once as needed (anaphylaxis/allergic reaction). -     Ambulatory referral to Allergy  History of allergy to shellfish -  Ambulatory referral to Allergy    Food allergies Avoid all seafood and nut products (read labels for nut oils) until evaluated by Allergy Start Xyzal and Pepcid daily Continue Singulair Use Benadryl as needed for itching Use Epi-pen for signs and symptoms of severe allergic reaction  Patient counseled on appropriate use of epi-pen and proper side effects. Counseled that she will need to be evaluated by healthcare professional if she uses the Epi-pen  Ganglion cyst of right wrist Consulted sports medicine today (see note)  Patient education and anticipatory guidance given Patient agrees with treatment plan Follow-up as needed if symptoms worsen or fail to improve  Levonne Hubert PA-C

## 2018-11-20 NOTE — Progress Notes (Signed)
Subjective:    I'm seeing this patient as a consultation for: Gena Fray, PA-C  CC: Lump on wrist  HPI: For over a year now this pleasant 43 year old female has had a large lump on the dorsum of her right wrist.  A year ago she had it aspirated and injected, it did return.  Symptoms are moderate, persistent, localized without radiation.  I reviewed the past medical history, family history, social history, surgical history, and allergies today and no changes were needed.  Please see the problem list section below in epic for further details.  Past Medical History: Past Medical History:  Diagnosis Date  . Allergy   . Sickle cell trait [facility])    Past Surgical History: Past Surgical History:  Procedure Laterality Date  . CESAREAN SECTION     x4  . CESAREAN SECTION WITH BILATERAL TUBAL LIGATION     Social History: Social History   Socioeconomic History  . Marital status: Married    Spouse name: Not on file  . Number of children: Not on file  . Years of education: Not on file  . Highest education level: Not on file  Occupational History  . Not on file  Social Needs  . Financial resource strain: Not on file  . Food insecurity:    Worry: Not on file    Inability: Not on file  . Transportation needs:    Medical: Not on file    Non-medical: Not on file  Tobacco Use  . Smoking status: Never Smoker  . Smokeless tobacco: Never Used  Substance and Sexual Activity  . Alcohol use: Never    Frequency: Never  . Drug use: Not on file  . Sexual activity: Yes    Birth control/protection: Surgical  Lifestyle  . Physical activity:    Days per week: Not on file    Minutes per session: Not on file  . Stress: Not on file  Relationships  . Social connections:    Talks on phone: Not on file    Gets together: Not on file    Attends religious service: Not on file    Active member of club or organization: Not on file    Attends meetings of clubs or organizations: Not on file     Relationship status: Not on file  Other Topics Concern  . Not on file  Social History Narrative  . Not on file   Family History: History reviewed. No pertinent family history. Allergies: No Known Allergies Medications: See med rec.  Review of Systems: No headache, visual changes, nausea, vomiting, diarrhea, constipation, dizziness, abdominal pain, skin rash, fevers, chills, night sweats, weight loss, swollen lymph nodes, body aches, joint swelling, muscle aches, chest pain, shortness of breath, mood changes, visual or auditory hallucinations.   Objective:   General: Well Developed, well nourished, and in no acute distress.  Neuro:  Extra-ocular muscles intact, able to move all 4 extremities, sensation grossly intact.  Deep tendon reflexes tested were normal. Psych: Alert and oriented, mood congruent with affect. ENT:  Ears and nose appear unremarkable.  Hearing grossly normal. Neck: Unremarkable overall appearance, trachea midline.  No visible thyroid enlargement. Eyes: Conjunctivae and lids appear unremarkable.  Pupils equal and round. Skin: Warm and dry, no rashes noted.  Cardiovascular: Pulses palpable, no extremity edema. Right wrist: Large dorsal ganglion cyst. ROM smooth and normal with good flexion and extension and ulnar/radial deviation that is symmetrical with opposite wrist. Palpation is normal over metacarpals, navicular, lunate, and TFCC;  tendons without tenderness/ swelling No snuffbox tenderness. No tenderness over Canal of Guyon. Strength 5/5 in all directions without pain. Negative tinel's and phalens signs. Negative Finkelstein sign. Negative Watson's test.  Procedure: Real-time Ultrasound Guided aspiration/injection of right dorsal wrist ganglion Device: GE Logiq E  Verbal informed consent obtained.  Time-out conducted.  Noted no overlying erythema, induration, or other signs of local infection.  Skin prepped in a sterile fashion.  Local anesthesia:  Topical Ethyl chloride.  With sterile technique and under real time ultrasound guidance:  Using 18-gauge needle aspirated about 3 cc of thick, straw-colored fluid, syringe switched and 1 cc Kenalog 40 injected easily. Completed without difficulty  Pain immediately resolved suggesting accurate placement of the medication.  Advised to call if fevers/chills, erythema, induration, drainage, or persistent bleeding.  Images permanently stored and available for review in the ultrasound unit.  Impression: Technically successful ultrasound guided injection.  Impression and Recommendations:   This case required medical decision making of moderate complexity.  Ganglion cyst of dorsum of right wrist Aspiration and injection. Return in 1 month.   ___________________________________________ Ihor Austin. Benjamin Stain, M.D., ABFM., CAQSM. Primary Care and Sports Medicine [facility]  Adjunct Professor of Family Medicine  [employer]

## 2018-11-20 NOTE — Assessment & Plan Note (Signed)
Aspiration and injection. Return in 1 month. 

## 2018-12-18 ENCOUNTER — Ambulatory Visit: Payer: 59 | Admitting: Sports Medicine

## 2019-02-19 LAB — HM MAMMOGRAPHY

## 2019-10-08 ENCOUNTER — Ambulatory Visit (INDEPENDENT_AMBULATORY_CARE_PROVIDER_SITE_OTHER): Payer: 59 | Admitting: Family Medicine

## 2019-10-08 ENCOUNTER — Other Ambulatory Visit: Payer: Self-pay

## 2019-10-08 ENCOUNTER — Encounter: Payer: Self-pay | Admitting: Family Medicine

## 2019-10-08 VITALS — BP 116/70 | HR 88 | Wt 212.0 lb

## 2019-10-08 DIAGNOSIS — Z0000 Encounter for general adult medical examination without abnormal findings: Secondary | ICD-10-CM | POA: Insufficient documentation

## 2019-10-08 DIAGNOSIS — Z8639 Personal history of other endocrine, nutritional and metabolic disease: Secondary | ICD-10-CM | POA: Diagnosis not present

## 2019-10-08 DIAGNOSIS — Z13220 Encounter for screening for lipoid disorders: Secondary | ICD-10-CM | POA: Diagnosis not present

## 2019-10-08 NOTE — Patient Instructions (Addendum)
Great to meet you today! Stop downstairs and have labs completed.  We'll be in touch with results.  I have re-entered your allergy referral.  Follow up in 1 year or sooner if needed.    Preventive Care 22-44 Years Old, Female Preventive care refers to visits with your health care provider and lifestyle choices that can promote health and wellness. This includes:  A yearly physical exam. This may also be called an annual well check.  Regular dental visits and eye exams.  Immunizations.  Screening for certain conditions.  Healthy lifestyle choices, such as eating a healthy diet, getting regular exercise, not using drugs or products that contain nicotine and tobacco, and limiting alcohol use. What can I expect for my preventive care visit? Physical exam Your health care provider will check your:  Height and weight. This may be used to calculate body mass index (BMI), which tells if you are at a healthy weight.  Heart rate and blood pressure.  Skin for abnormal spots. Counseling Your health care provider may ask you questions about your:  Alcohol, tobacco, and drug use.  Emotional well-being.  Home and relationship well-being.  Sexual activity.  Eating habits.  Work and work Statistician.  Method of birth control.  Menstrual cycle.  Pregnancy history. What immunizations do I need?  Influenza (flu) vaccine  This is recommended every year. Tetanus, diphtheria, and pertussis (Tdap) vaccine  You may need a Td booster every 10 years. Varicella (chickenpox) vaccine  You may need this if you have not been vaccinated. Zoster (shingles) vaccine  You may need this after age 64. Measles, mumps, and rubella (MMR) vaccine  You may need at least one dose of MMR if you were born in 1957 or later. You may also need a second dose. Pneumococcal conjugate (PCV13) vaccine  You may need this if you have certain conditions and were not previously vaccinated. Pneumococcal  polysaccharide (PPSV23) vaccine  You may need one or two doses if you smoke cigarettes or if you have certain conditions. Meningococcal conjugate (MenACWY) vaccine  You may need this if you have certain conditions. Hepatitis A vaccine  You may need this if you have certain conditions or if you travel or work in places where you may be exposed to hepatitis A. Hepatitis B vaccine  You may need this if you have certain conditions or if you travel or work in places where you may be exposed to hepatitis B. Haemophilus influenzae type b (Hib) vaccine  You may need this if you have certain conditions. Human papillomavirus (HPV) vaccine  If recommended by your health care provider, you may need three doses over 6 months. You may receive vaccines as individual doses or as more than one vaccine together in one shot (combination vaccines). Talk with your health care provider about the risks and benefits of combination vaccines. What tests do I need? Blood tests  Lipid and cholesterol levels. These may be checked every 5 years, or more frequently if you are over 39 years old.  Hepatitis C test.  Hepatitis B test. Screening  Lung cancer screening. You may have this screening every year starting at age 87 if you have a 30-pack-year history of smoking and currently smoke or have quit within the past 15 years.  Colorectal cancer screening. All adults should have this screening starting at age 40 and continuing until age 29. Your health care provider may recommend screening at age 18 if you are at increased risk. You will have tests  every 1-10 years, depending on your results and the type of screening test.  Diabetes screening. This is done by checking your blood sugar (glucose) after you have not eaten for a while (fasting). You may have this done every 1-3 years.  Mammogram. This may be done every 1-2 years. Talk with your health care provider about when you should start having regular  mammograms. This may depend on whether you have a family history of breast cancer.  BRCA-related cancer screening. This may be done if you have a family history of breast, ovarian, tubal, or peritoneal cancers.  Pelvic exam and Pap test. This may be done every 3 years starting at age 70. Starting at age 25, this may be done every 5 years if you have a Pap test in combination with an HPV test. Other tests  Sexually transmitted disease (STD) testing.  Bone density scan. This is done to screen for osteoporosis. You may have this scan if you are at high risk for osteoporosis. Follow these instructions at home: Eating and drinking  Eat a diet that includes fresh fruits and vegetables, whole grains, lean protein, and low-fat dairy.  Take vitamin and mineral supplements as recommended by your health care provider.  Do not drink alcohol if: ? Your health care provider tells you not to drink. ? You are pregnant, may be pregnant, or are planning to become pregnant.  If you drink alcohol: ? Limit how much you have to 0-1 drink a day. ? Be aware of how much alcohol is in your drink. In the [place] one drink equals one 12 oz bottle of beer (355 mL), one 5 oz glass of wine (148 mL), or one 1 oz glass of hard liquor (44 mL). Lifestyle  Take daily care of your teeth and gums.  Stay active. Exercise for at least 30 minutes on 5 or more days each week.  Do not use any products that contain nicotine or tobacco, such as cigarettes, e-cigarettes, and chewing tobacco. If you need help quitting, ask your health care provider.  If you are sexually active, practice safe sex. Use a condom or other form of birth control (contraception) in order to prevent pregnancy and STIs (sexually transmitted infections).  If told by your health care provider, take low-dose aspirin daily starting at age 64. What's next?  Visit your health care provider once a year for a well check visit.  Ask your health care provider  how often you should have your eyes and teeth checked.  Stay up to date on all vaccines. This information is not intended to replace advice given to you by your health care provider. Make sure you discuss any questions you have with your health care provider. Document Revised: 04/30/2018 Document Reviewed: 04/30/2018 Elsevier Patient Education  2020 [employer].

## 2019-10-08 NOTE — Assessment & Plan Note (Addendum)
Well adult.  Orders Placed This Encounter  Procedures  . COMPLETE METABOLIC PANEL WITH GFR  . CBC  . Lipid Profile  . Ferritin  Screening: Lipid profile.  Immunizations: Declines flu and tdap today.  Anticipatory guidance/risk factor reduction:  Recommend health diet and 30 minutes or more of daily exercise.  Additional recommendations per AVS.

## 2019-10-08 NOTE — Progress Notes (Signed)
Jamie Lawson - 44 y.o. female MRN [id number]  Date of birth: 1975-11-14  Subjective Chief Complaint  Patient presents with  . Follow-up    wants labwork done. Having H/A even when wearing her glasses    HPI Jamie Lawson is a 44 y.o. female wit history of sickle cell trait and chronic allergies here today for annual exam.  She has had some infrequent headaches.  Possibly related to not sleeping well and increased screen time at new job.  She doesn't feel like these are severe enough that she would want to try medication.   Pap and mammo completed at [facility]-02/2019.    She is a non-smoker.  She denies EtOH use  She tries to follow a healthy diet.  She exercises occasionally.   Review of Systems  Constitutional: Negative for chills, fever, malaise/fatigue and weight loss.  HENT: Negative for congestion, ear pain and sore throat.   Eyes: Negative for blurred vision, double vision and pain.  Respiratory: Negative for cough and shortness of breath.   Cardiovascular: Negative for chest pain and palpitations.  Gastrointestinal: Negative for abdominal pain, blood in stool, constipation, heartburn and nausea.  Genitourinary: Negative for dysuria and urgency.  Musculoskeletal: Negative for joint pain and myalgias.  Neurological: Negative for dizziness and headaches.  Endo/Heme/Allergies: Does not bruise/bleed easily.  Psychiatric/Behavioral: Negative for depression. The patient is not nervous/anxious and does not have insomnia.      No Known Allergies  Past Medical History:  Diagnosis Date  . Allergy   . Sickle cell trait [facility])     Past Surgical History:  Procedure Laterality Date  . CESAREAN SECTION     x4  . CESAREAN SECTION WITH BILATERAL TUBAL LIGATION      Social History   Socioeconomic History  . Marital status: Married    Spouse name: Not on file  . Number of children: Not on file  . Years of education: Not on file  . Highest  education level: Not on file  Occupational History  . Not on file  Tobacco Use  . Smoking status: Never Smoker  . Smokeless tobacco: Never Used  Substance and Sexual Activity  . Alcohol use: Never  . Drug use: Not on file  . Sexual activity: Yes    Birth control/protection: Surgical  Other Topics Concern  . Not on file  Social History Narrative  . Not on file   Social Determinants of Health   Financial Resource Strain:   . Difficulty of Paying Living Expenses: Not on file  Food Insecurity:   . Worried About Charity fundraiser in the Last Year: Not on file  . Ran Out of Food in the Last Year: Not on file  Transportation Needs:   . Lack of Transportation (Medical): Not on file  . Lack of Transportation (Non-Medical): Not on file  Physical Activity:   . Days of Exercise per Week: Not on file  . Minutes of Exercise per Session: Not on file  Stress:   . Feeling of Stress : Not on file  Social Connections:   . Frequency of Communication with Friends and Family: Not on file  . Frequency of Social Gatherings with Friends and Family: Not on file  . Attends Religious Services: Not on file  . Active Member of Clubs or Organizations: Not on file  . Attends Archivist Meetings: Not on file  . Marital Status: Not on file    History reviewed. No pertinent  family history.  Health Maintenance  Topic Date Due  . HIV Screening  10/15/1990  . TETANUS/TDAP  10/15/1994  . PAP SMEAR-Modifier  10/15/1996  . MAMMOGRAM  12/26/2018  . INFLUENZA VACCINE  04/03/2019    ----------------------------------------------------------------------------------------------------------------------------------------------------------------------------------------------------------------- Physical Exam BP 116/70   Pulse 88   Wt 212 lb (96.2 kg)   SpO2 98%   BMI 32.23 kg/m   Physical Exam Constitutional:      General: She is not in acute distress. HENT:     Head: Normocephalic and  atraumatic.     Right Ear: Tympanic membrane normal.     Left Ear: Tympanic membrane normal.     Nose: Nose normal.     Mouth/Throat:     Mouth: Mucous membranes are moist.  Eyes:     General: No scleral icterus.    Conjunctiva/sclera: Conjunctivae normal.  Neck:     Thyroid: No thyromegaly.  Cardiovascular:     Rate and Rhythm: Normal rate and regular rhythm.     Heart sounds: Normal heart sounds.  Pulmonary:     Effort: Pulmonary effort is normal.     Breath sounds: Normal breath sounds.  Abdominal:     General: Bowel sounds are normal. There is no distension.     Palpations: Abdomen is soft.     Tenderness: There is no abdominal tenderness. There is no guarding.  Musculoskeletal:        General: Normal range of motion.     Cervical back: Normal range of motion and neck supple.  Lymphadenopathy:     Cervical: No cervical adenopathy.  Skin:    General: Skin is warm and dry.     Findings: No rash.  Neurological:     General: No focal deficit present.     Mental Status: She is alert and oriented to person, place, and time.     Cranial Nerves: No cranial nerve deficit.     Coordination: Coordination normal.  Psychiatric:        Mood and Affect: Mood normal.        Behavior: Behavior normal.     ------------------------------------------------------------------------------------------------------------------------------------------------------------------------------------------------------------------- Assessment and Plan  Well adult exam Well adult.  Orders Placed This Encounter  Procedures  . COMPLETE METABOLIC PANEL WITH GFR  . CBC  . Lipid Profile  . Ferritin  Screening: Lipid profile.  Immunizations: Declines flu and tdap today.  Anticipatory guidance/risk factor reduction:  Recommend health diet and 30 minutes or more of daily exercise.  Additional recommendations per AVS.      This visit occurred during the SARS-CoV-2 public health emergency.  Safety  protocols were in place, including screening questions prior to the visit, additional usage of staff PPE, and extensive cleaning of exam room while observing appropriate contact time as indicated for disinfecting solutions.

## 2019-10-09 LAB — LIPID PANEL
Cholesterol: 249 mg/dL — ABNORMAL HIGH (ref <200)
HDL: 48 mg/dL — ABNORMAL LOW (ref >=50)
LDL Cholesterol (Calc): 171 mg/dL (calc) — ABNORMAL HIGH
Non-HDL Cholesterol (Calc): 201 mg/dL (calc) — ABNORMAL HIGH (ref <130)
Total CHOL/HDL Ratio: 5.2 (calc) — ABNORMAL HIGH (ref <5.0)
Triglycerides: 155 mg/dL — ABNORMAL HIGH (ref <150)

## 2019-10-09 LAB — COMPLETE METABOLIC PANEL WITH GFR
AG Ratio: 1.6 (calc) (ref 1.0–2.5)
ALT: 19 U/L (ref 6–29)
AST: 15 U/L (ref 10–30)
Albumin: 4.4 g/dL (ref 3.6–5.1)
Alkaline phosphatase (APISO): 56 U/L (ref 31–125)
BUN: 24 mg/dL (ref 7–25)
CO2: 27 mmol/L (ref 20–32)
Calcium: 10.2 mg/dL (ref 8.6–10.2)
Chloride: 107 mmol/L (ref 98–110)
Creat: 0.85 mg/dL (ref 0.50–1.10)
GFR, Est African American: 97 mL/min/{1.73_m2} (ref >=60)
GFR, Est Non African American: 84 mL/min/{1.73_m2} (ref >=60)
Globulin: 2.7 g/dL (calc) (ref 1.9–3.7)
Glucose, Bld: 103 mg/dL (ref 65–139)
Potassium: 4.2 mmol/L (ref 3.5–5.3)
Sodium: 142 mmol/L (ref 135–146)
Total Bilirubin: 0.3 mg/dL (ref 0.2–1.2)
Total Protein: 7.1 g/dL (ref 6.1–8.1)

## 2019-10-09 LAB — CBC
HCT: 37 % (ref 35.0–45.0)
Hemoglobin: 12.1 g/dL (ref 11.7–15.5)
MCH: 28.1 pg (ref 27.0–33.0)
MCHC: 32.7 g/dL (ref 32.0–36.0)
MCV: 86 fL (ref 80.0–100.0)
MPV: 11.3 fL (ref 7.5–12.5)
Platelets: 305 10*3/uL (ref 140–400)
RBC: 4.3 10*6/uL (ref 3.80–5.10)
RDW: 13.4 % (ref 11.0–15.0)
WBC: 5.2 10*3/uL (ref 3.8–10.8)

## 2019-10-09 LAB — FERRITIN: Ferritin: 36 ng/mL (ref 16–232)

## 2019-10-14 ENCOUNTER — Other Ambulatory Visit: Payer: Self-pay | Admitting: Family Medicine

## 2019-10-14 MED ORDER — ATORVASTATIN CALCIUM 20 MG PO TABS: 20.0000 mg | ORAL_TABLET | Freq: Every day | ORAL | 3 refills | Status: DC

## 2019-11-09 ENCOUNTER — Telehealth: Payer: Self-pay | Admitting: Physician Assistant

## 2019-11-09 NOTE — Telephone Encounter (Signed)
-----   Message from Christen Butter, NP sent at 11/09/2019 11:36 AM EST ----- Regarding: Cervical cancer screening update Patient overdue for pap according to our records. Please contact to see if she has had this done in the last 5 years. If so, please respond with date and provider that completed it. If not, please offer to schedule appointment for pap smear to be done.

## 2019-11-09 NOTE — Telephone Encounter (Signed)
Left voicemail for patient to call us back to make an appointment for the information below.

## 2019-11-11 ENCOUNTER — Encounter: Payer: Self-pay | Admitting: Family Medicine

## 2020-01-07 ENCOUNTER — Emergency Department
Admission: EM | Admit: 2020-01-07 | Discharge: 2020-01-07 | Disposition: A | Discharge status: Home / Self Care | Payer: 59 | Source: Home / Self Care

## 2020-01-07 DIAGNOSIS — K219 Gastro-esophageal reflux disease without esophagitis: Secondary | ICD-10-CM

## 2020-01-07 DIAGNOSIS — R1012 Left upper quadrant pain: Secondary | ICD-10-CM | POA: Diagnosis not present

## 2020-01-07 LAB — POCT FASTING CBG KUC MANUAL ENTRY: POCT Glucose (KUC): 116 mg/dL — AB (ref 70–99)

## 2020-01-07 MED ORDER — OMEPRAZOLE 20 MG PO CPDR: 20.0000 mg | DELAYED_RELEASE_CAPSULE | Freq: Two times a day (BID) | ORAL | 0 refills | Status: DC

## 2020-01-07 NOTE — ED Triage Notes (Signed)
Patient presents to Urgent Care with complaints of left sided abdominal pain since two years ago. Patient reports she had one episode on vomiting this morning while brushing her teeth. Pt also has a headache since yesterday, took ibuprofen pta and it has helped.

## 2020-01-07 NOTE — Discharge Instructions (Addendum)
  You may take the medication prescribed today to see if it helps again. Try to avoid spicy and acidic foods and drinks until you are feeling better. Limit the amount of antiinflammatory medication such as ibuprofen to help reduce stomach irritation. If you do take medication, be sure to take with foods.  It is recommended you call to schedule an appointment with your family doctor next week for further evaluation and treatment of your recurrent abdominal pain.   Call 911 or go to the hospital if abdominal pain becomes severe, unable to keep down fluids, dizziness, or other concerning symptoms develop.

## 2020-01-07 NOTE — ED Provider Notes (Signed)
[facility]    CSN: [id number] Arrival date & time: 01/07/20  0914      History   Chief Complaint Chief Complaint  Patient presents with  . Abdominal Pain    HPI Jamie Lawson is a 44 y.o. female.   HPI  Jamie Lawson is a 44 y.o. female presenting to UC with c/o LUQ abdominal pain that is "twisting" and burning sensation, "the same pain I had 2 years ago when I came here."  Pt had a normal workup at that time, 08/11/18; she was prescribed omeprazole and reported almost immediate relief of pain. Current symptoms started last night, preceded by a headache. Pt took 2 ibuprofen for her HA.  This morning she has had belching and heart burn along with one episode of vomiting while brushing her teeth this morning.  She has not f/u with her PCP.  She does report eating a lot of spicy foods and drinking coffee at work but does not take any daily acid reflux medications. Denies fever, chills, nausea or diarrhea. No sick contacts or recent travel.    Past Medical History:  Diagnosis Date  . Allergy   . Sickle cell trait [facility])     Patient Active Problem List   Diagnosis Date Noted  . Well adult exam 10/08/2019  . Environmental allergies 11/20/2018  . Allergic reaction to peanut 11/20/2018  . History of allergy to shellfish 11/20/2018  . Ganglion cyst of dorsum of right wrist 11/20/2018  . Absolute anemia 01/26/2018  . Chronic bilateral thoracic back pain 01/26/2018    Past Surgical History:  Procedure Laterality Date  . CESAREAN SECTION     x4  . CESAREAN SECTION WITH BILATERAL TUBAL LIGATION      OB History    Gravida  4   Para  4   Term      Preterm      AB      Living        SAB      TAB      Ectopic      Multiple      Live Births               Home Medications    Prior to Admission medications   Medication Sig Start Date End Date Taking? Authorizing Provider  famotidine (PEPCID) 20 MG tablet Take 1 tablet (20 mg  total) by mouth 2 (two) times daily. 11/20/18  Yes Carlis Stable, PA-C  atorvastatin (LIPITOR) 20 MG tablet Take 1 tablet (20 mg total) by mouth daily. 10/14/19   Everrett Coombe, DO  diphenhydrAMINE (BENADRYL) 50 MG capsule Take 50 mg by mouth every 6 (six) hours as needed for allergies.    [provider]  EPINEPHrine 0.3 mg/0.3 mL IJ SOAJ injection Inject 0.3 mLs (0.3 mg total) into the muscle once as needed (anaphylaxis/allergic reaction). 11/20/18   Carlis Stable, PA-C  levocetirizine (XYZAL) 5 MG tablet Take 5 mg by mouth every evening.    [provider]  montelukast (SINGULAIR) 10 MG tablet Take one tab by mouth at bedtime 05/11/18   Lattie Haw, MD  omeprazole (PRILOSEC) 20 MG capsule Take 1 capsule (20 mg total) by mouth 2 (two) times daily before a meal. 01/07/20 02/06/20  Lurene Shadow, PA-C    Family History Family History  Problem Relation Age of Onset  . Healthy Mother   . Healthy Father     Social History Social History  Tobacco Use  . Smoking status: Never Smoker  . Smokeless tobacco: Never Used  Substance Use Topics  . Alcohol use: Never  . Drug use: Not on file     Allergies   Patient has no known allergies.   Review of Systems Review of Systems  Constitutional: Negative for appetite change, chills, fatigue and fever.  Respiratory: Negative for cough, chest tightness and shortness of breath.   Cardiovascular: Negative for chest pain and palpitations.  Gastrointestinal: Positive for abdominal pain and vomiting (gagged when brushing her teeth). Negative for constipation, diarrhea and nausea.  Genitourinary: Negative for dysuria, flank pain, frequency and hematuria.  Musculoskeletal: Negative for back pain.  Neurological: Negative for dizziness and headaches.     Physical Exam Triage Vital Signs ED Triage Vitals  Enc Vitals Group     BP 01/07/20 0932 104/76     Pulse Rate 01/07/20 0932 68     Resp 01/07/20  0932 16     Temp 01/07/20 0932 99 F (37.2 C)     Temp Source 01/07/20 0932 Oral     SpO2 01/07/20 0932 99 %     Weight --      Height --      Head Circumference --      Peak Flow --      Pain Score 01/07/20 0930 8     Pain Loc --      Pain Edu? --      Excl. in [place]? --    No data found.  Updated Vital Signs BP 104/76 (BP Location: Right Arm)   Pulse 68   Temp 99 F (37.2 C) (Oral)   Resp 16   SpO2 99%   Visual Acuity Right Eye Distance:   Left Eye Distance:   Bilateral Distance:    Right Eye Near:   Left Eye Near:    Bilateral Near:     Physical Exam Vitals and nursing note reviewed.  Constitutional:      General: She is not in acute distress.    Appearance: She is well-developed. She is not ill-appearing, toxic-appearing or diaphoretic.  HENT:     Head: Normocephalic and atraumatic.     Mouth/Throat:     Mouth: Mucous membranes are moist.  Cardiovascular:     Rate and Rhythm: Normal rate and regular rhythm.  Pulmonary:     Effort: Pulmonary effort is normal. No respiratory distress.     Breath sounds: Normal breath sounds.  Abdominal:     General: Bowel sounds are normal. There is no distension.     Palpations: Abdomen is soft.     Tenderness: There is no abdominal tenderness. There is no right CVA tenderness, left CVA tenderness, guarding or rebound. Negative signs include Murphy's sign and McBurney's sign.  Musculoskeletal:        General: Normal range of motion.     Cervical back: Normal range of motion.  Skin:    General: Skin is warm and dry.  Neurological:     Mental Status: She is alert and oriented to person, place, and time.  Psychiatric:        Behavior: Behavior normal.      UC Treatments / Results  Labs (all labs ordered are listed, but only abnormal results are displayed) Labs Reviewed  POCT FASTING CBG [facility] MANUAL ENTRY - Abnormal; Notable for the following components:      Result Value   POCT Glucose ([facility]) 116 (*)    All other  components within normal limits    EKG   Radiology No results found.  Procedures Procedures (including critical care time)  Medications Ordered in UC Medications - No data to display  Initial Impression / Assessment and Plan / UC Course  I have reviewed the triage vital signs and the nursing notes.  Pertinent labs & imaging results that were available during my care of the patient were reviewed by me and considered in my medical decision making (see chart for details).     Normal exam Reviewed medical records Benign abdominal exam Will start pt back on omeprazole  Encouraged f/u with PCP Discussed symptoms that warrant emergent care in the ED. AVS provided  Final Clinical Impressions(s) / UC Diagnoses   Final diagnoses:  LUQ abdominal pain  Gastroesophageal reflux disease, unspecified whether esophagitis present     Discharge Instructions      You may take the medication prescribed today to see if it helps again. Try to avoid spicy and acidic foods and drinks until you are feeling better. Limit the amount of antiinflammatory medication such as ibuprofen to help reduce stomach irritation. If you do take medication, be sure to take with foods.  It is recommended you call to schedule an appointment with your family doctor next week for further evaluation and treatment of your recurrent abdominal pain.   Call 911 or go to the hospital if abdominal pain becomes severe, unable to keep down fluids, dizziness, or other concerning symptoms develop.     ED Prescriptions    Medication Sig Dispense Auth. Provider   omeprazole (PRILOSEC) 20 MG capsule Take 1 capsule (20 mg total) by mouth 2 (two) times daily before a meal. 60 capsule Lurene Shadow, PA-C     PDMP not reviewed this encounter.   Lurene Shadow, [place] 01/07/20 1313

## 2020-05-04 ENCOUNTER — Other Ambulatory Visit: Payer: Self-pay | Admitting: Nurse Practitioner

## 2020-05-04 ENCOUNTER — Other Ambulatory Visit: Payer: Self-pay

## 2020-05-04 ENCOUNTER — Ambulatory Visit (INDEPENDENT_AMBULATORY_CARE_PROVIDER_SITE_OTHER): Payer: 59

## 2020-05-04 DIAGNOSIS — Z1231 Encounter for screening mammogram for malignant neoplasm of breast: Secondary | ICD-10-CM

## 2020-07-03 ENCOUNTER — Encounter: Payer: Self-pay | Admitting: Family Medicine

## 2020-07-03 ENCOUNTER — Telehealth (INDEPENDENT_AMBULATORY_CARE_PROVIDER_SITE_OTHER): Payer: 59 | Admitting: Family Medicine

## 2020-07-03 DIAGNOSIS — U071 COVID-19: Secondary | ICD-10-CM | POA: Insufficient documentation

## 2020-07-03 MED ORDER — PREDNISONE 20 MG PO TABS
20.0000 mg | ORAL_TABLET | Freq: Two times a day (BID) | ORAL | 0 refills | Status: DC
Start: 2020-07-03 — End: 2023-09-23

## 2020-07-03 MED ORDER — ALBUTEROL SULFATE HFA 108 (90 BASE) MCG/ACT IN AERS
2.0000 | INHALATION_SPRAY | Freq: Four times a day (QID) | RESPIRATORY_TRACT | 0 refills | Status: DC | PRN
Start: 2020-07-03 — End: 2020-09-12

## 2020-07-03 NOTE — Progress Notes (Signed)
Symptoms started on 06/25/2020  Cough x 10 days SOB

## 2020-07-03 NOTE — Assessment & Plan Note (Signed)
Positive for COVID Recommend supportive care with increased fluids and rest while isolating at home.  Will add albuterol and prednisone burst for history of asthma.  She may qualify for MAB infusion and she is interested in having this completed.  Discussed if having significantly worsening shortness of breath, difficulty eating/drinking, or severe fatigue she should be seen in ED for evaluation.

## 2020-07-03 NOTE — Progress Notes (Signed)
Jamie Lawson - 44 y.o. female MRN [id number]  Date of birth: 02-11-1976   This visit type was conducted due to national recommendations for restrictions regarding the COVID-19 Pandemic (e.g. social distancing).  This format is felt to be most appropriate for this patient at this time.  All issues noted in this document were discussed and addressed.  No physical exam was performed (except for noted visual exam findings with Video Visits).  I discussed the limitations of evaluation and management by telemedicine and the availability of in person appointments. The patient expressed understanding and agreed to proceed.  I connected with@ on 07/03/20 at  4:00 PM EDT by a video enabled telemedicine application and verified that I am speaking with the correct person using two identifiers.  Present at visit: Everrett Coombe, DO Virgel Gess Henion   Patient Location: Home [address] [place] [address]   Provider location:   [facility]  Chief Complaint  Patient presents with  . Covid Positive    HPI  Jamie Lawson is a 44 y.o. female who presents via Web designer for a telehealth visit today.  She tested positive for COVID at [facility] 1 week ago.  Her symptoms started on 10/22.  She has several other family members positive as well.  Current symptoms include cough, mild wheezing, shortness of breath, and fatigue.  She feels better today compare to yesterday.  She has not had fever in a couple of days.  She is eating and drinking ok.  She does have a history of asthma and allergies.      ROS:  A comprehensive ROS was completed and negative except as noted per HPI  Past Medical History:  Diagnosis Date  . Allergy   . Sickle cell trait [facility])     Past Surgical History:  Procedure Laterality Date  . CESAREAN SECTION     x4  . CESAREAN SECTION WITH BILATERAL TUBAL LIGATION      Family History  Problem Relation Age of Onset  . Healthy Mother   .  Healthy Father     Social History   Socioeconomic History  . Marital status: Married    Spouse name: Not on file  . Number of children: Not on file  . Years of education: Not on file  . Highest education level: Not on file  Occupational History  . Not on file  Tobacco Use  . Smoking status: Never Smoker  . Smokeless tobacco: Never Used  Substance and Sexual Activity  . Alcohol use: Never  . Drug use: Not on file  . Sexual activity: Yes    Birth control/protection: Surgical  Other Topics Concern  . Not on file  Social History Narrative  . Not on file   Social Determinants of Health   Financial Resource Strain:   . Difficulty of Paying Living Expenses: Not on file  Food Insecurity:   . Worried About Programme researcher, broadcasting/film/video in the Last Year: Not on file  . Ran Out of Food in the Last Year: Not on file  Transportation Needs:   . Lack of Transportation (Medical): Not on file  . Lack of Transportation (Non-Medical): Not on file  Physical Activity:   . Days of Exercise per Week: Not on file  . Minutes of Exercise per Session: Not on file  Stress:   . Feeling of Stress : Not on file  Social Connections:   . Frequency of Communication with Friends and Family: Not on file  .  Frequency of Social Gatherings with Friends and Family: Not on file  . Attends Religious Services: Not on file  . Active Member of Clubs or Organizations: Not on file  . Attends Banker Meetings: Not on file  . Marital Status: Not on file  Intimate Partner Violence:   . Fear of Current or Ex-Partner: Not on file  . Emotionally Abused: Not on file  . Physically Abused: Not on file  . Sexually Abused: Not on file     Current Outpatient Medications:  .  atorvastatin (LIPITOR) 20 MG tablet, Take 1 tablet (20 mg total) by mouth daily., Disp: 90 tablet, Rfl: 3 .  diphenhydrAMINE (BENADRYL) 50 MG capsule, Take 50 mg by mouth every 6 (six) hours as needed for allergies., Disp: , Rfl:  .   EPINEPHrine 0.3 mg/0.3 mL IJ SOAJ injection, Inject 0.3 mLs (0.3 mg total) into the muscle once as needed (anaphylaxis/allergic reaction)., Disp: 2 Device, Rfl: 1 .  famotidine (PEPCID) 20 MG tablet, Take 1 tablet (20 mg total) by mouth 2 (two) times daily., Disp: , Rfl:  .  levocetirizine (XYZAL) 5 MG tablet, Take 5 mg by mouth every evening., Disp: , Rfl:  .  montelukast (SINGULAIR) 10 MG tablet, Take one tab by mouth at bedtime, Disp: 30 tablet, Rfl: 1 .  albuterol (VENTOLIN HFA) 108 (90 Base) MCG/ACT inhaler, Inhale 2 puffs into the lungs every 6 (six) hours as needed for wheezing or shortness of breath., Disp: 8 g, Rfl: 0 .  omeprazole (PRILOSEC) 20 MG capsule, Take 1 capsule (20 mg total) by mouth 2 (two) times daily before a meal., Disp: 60 capsule, Rfl: 0 .  predniSONE (DELTASONE) 20 MG tablet, Take 1 tablet (20 mg total) by mouth 2 (two) times daily with a meal for 5 days., Disp: 10 tablet, Rfl: 0  EXAM:  VITALS per patient if applicable: There were no vitals taken for this visit.  GENERAL: alert, oriented, appears well and in no acute distress  HEENT: atraumatic, conjunttiva clear, no obvious abnormalities on inspection of external nose and ears  NECK: normal movements of the head and neck  LUNGS: on inspection no signs of respiratory distress, breathing rate appears normal, no obvious gross SOB, gasping or wheezing  CV: no obvious cyanosis  MS: moves all visible extremities without noticeable abnormality  PSYCH/NEURO: pleasant and cooperative, no obvious depression or anxiety, speech and thought processing grossly intact  ASSESSMENT AND PLAN:  Discussed the following assessment and plan:  COVID-19 Positive for COVID Recommend supportive care with increased fluids and rest while isolating at home.  Will add albuterol and prednisone burst for history of asthma.  She may qualify for MAB infusion and she is interested in having this completed.  Discussed if having  significantly worsening shortness of breath, difficulty eating/drinking, or severe fatigue she should be seen in ED for evaluation.    Meds ordered this encounter  Medications  . predniSONE (DELTASONE) 20 MG tablet    Sig: Take 1 tablet (20 mg total) by mouth 2 (two) times daily with a meal for 5 days.    Dispense:  10 tablet    Refill:  0  . albuterol (VENTOLIN HFA) 108 (90 Base) MCG/ACT inhaler    Sig: Inhale 2 puffs into the lungs every 6 (six) hours as needed for wheezing or shortness of breath.    Dispense:  8 g    Refill:  0      I discussed the assessment and treatment  plan with the patient. The patient was provided an opportunity to ask questions and all were answered. The patient agreed with the plan and demonstrated an understanding of the instructions.   The patient was advised to call back or seek an in-person evaluation if the symptoms worsen or if the condition fails to improve as anticipated.    Luetta Nutting, DO

## 2020-07-04 ENCOUNTER — Telehealth (HOSPITAL_COMMUNITY): Payer: Self-pay

## 2020-07-04 NOTE — Telephone Encounter (Signed)
Called to Discuss with patient about Covid symptoms and the use of the monoclonal antibody infusion for those with mild to moderate Covid symptoms and at a high risk of hospitalization.     Pt appears to qualify for this infusion due to co-morbid conditions and/or a member of an at-risk group in accordance with the FDA Emergency Use Authorization.    Unable to reach pt. Will send MyChart message as well.  Mieke Brinley Loyola Mast, RN

## 2020-07-04 NOTE — Telephone Encounter (Signed)
Prescreened patient for monoclonal antibody infusion after recently testing positive for COVID. Patient does not qualify based off of her symptom onset date of 10/22 putting her outside the range for infusion. Provided patient with hotline number if they experiences any changes and instructed to go the the ED with any worsening respiratory concerns.   Ziyan Schoon Loyola Mast, RN

## 2020-07-04 NOTE — Telephone Encounter (Signed)
I didn't see anything mentioned in your note yesterday about something for cough

## 2020-09-07 ENCOUNTER — Encounter: Payer: Self-pay | Admitting: Family Medicine

## 2020-09-07 ENCOUNTER — Ambulatory Visit (INDEPENDENT_AMBULATORY_CARE_PROVIDER_SITE_OTHER): Payer: 59 | Admitting: Family Medicine

## 2020-09-07 VITALS — BP 115/77 | HR 77 | Wt 214.2 lb

## 2020-09-07 DIAGNOSIS — Z23 Encounter for immunization: Secondary | ICD-10-CM

## 2020-09-07 DIAGNOSIS — R079 Chest pain, unspecified: Secondary | ICD-10-CM

## 2020-09-07 DIAGNOSIS — Z7184 Encounter for health counseling related to travel: Secondary | ICD-10-CM | POA: Diagnosis not present

## 2020-09-07 MED ORDER — TYPHOID VACCINE PO CPDR: 1.0000 | DELAYED_RELEASE_CAPSULE | ORAL | 0 refills | Status: DC

## 2020-09-07 MED ORDER — ATORVASTATIN CALCIUM 20 MG PO TABS: 20.0000 mg | ORAL_TABLET | Freq: Every day | ORAL | 3 refills | Status: DC

## 2020-09-07 MED ORDER — ATOVAQUONE-PROGUANIL HCL 250-100 MG PO TABS: ORAL_TABLET | ORAL | 0 refills | Status: DC

## 2020-09-07 NOTE — Progress Notes (Signed)
Jamie Lawson - 45 y.o. female MRN [id number]  Date of birth: 1976/08/03  Subjective Chief Complaint  Patient presents with  . Travel Consult  . Chest Pain    HPI Jamie Lawson is a 45 year old female here today to discuss upcoming travel.  She is returning to [place] to visit her family.  Her father is ill and she is going to visit him.  She would like to get recommended vaccinations and malaria prophylaxis prior to leaving.  She will be leaving on January 17.  She did have COVID infection in November 2021.  She did not receive monoclonal antibody.  She questions if she can receive flu and COVID vaccines at this time.  She is fairly sure that she has had yellow fever vaccine as this is required for entry into [place].  She is unsure if she has ever had typhoid vaccine.  She also has complaint of chest pain.  She describes this as sharp and nonradiating.  Pain comes and goes and seems to be more along the rib area.  She denies shortness of breath.    ROS:  A comprehensive ROS was completed and negative except as noted per HPI   No Known Allergies  Past Medical History:  Diagnosis Date  . Allergy   . Sickle cell trait [facility])     Past Surgical History:  Procedure Laterality Date  . CESAREAN SECTION     x4  . CESAREAN SECTION WITH BILATERAL TUBAL LIGATION      Social History   Socioeconomic History  . Marital status: Married    Spouse name: Not on file  . Number of children: Not on file  . Years of education: Not on file  . Highest education level: Not on file  Occupational History  . Not on file  Tobacco Use  . Smoking status: Never Smoker  . Smokeless tobacco: Never Used  Substance and Sexual Activity  . Alcohol use: Never  . Drug use: Not on file  . Sexual activity: Yes    Birth control/protection: Surgical  Other Topics Concern  . Not on file  Social History Narrative  . Not on file   Social Determinants of Health   Financial Resource Strain: Not  on file  Food Insecurity: Not on file  Transportation Needs: Not on file  Physical Activity: Not on file  Stress: Not on file  Social Connections: Not on file    Family History  Problem Relation Age of Onset  . Healthy Mother   . Healthy Father     Health Maintenance  Topic Date Due  . Hepatitis C Screening  Never done  . COVID-19 Vaccine (1) Never done  . HIV Screening  Never done  . PAP SMEAR-Modifier  03/01/2005  . TETANUS/TDAP  10/07/2020 (Originally 10/15/1994)  . MAMMOGRAM  05/04/2021  . INFLUENZA VACCINE  Completed     ----------------------------------------------------------------------------------------------------------------------------------------------------------------------------------------------------------------- Physical Exam BP 115/77 (BP Location: Left Arm, Patient Position: Sitting, Cuff Size: Large)   Pulse 77   Wt 214 lb 3.7 oz (97.2 kg)   SpO2 97%   BMI 32.57 kg/m   Physical Exam Constitutional:      Appearance: Normal appearance. She is well-developed.  HENT:     Head: Normocephalic and atraumatic.  Eyes:     General: No scleral icterus. Cardiovascular:     Rate and Rhythm: Normal rate and regular rhythm.  Pulmonary:     Effort: Pulmonary effort is normal.     Breath sounds: Normal  breath sounds.  Neurological:     General: No focal deficit present.     Mental Status: She is alert.  Psychiatric:        Mood and Affect: Mood normal.        Behavior: Behavior normal.    EKG: Normal sinus rhythm with normal rate and axis.  She has nonspecific T wave changes without any additional signs of ischemia. ------------------------------------------------------------------------------------------------------------------------------------------------------------------------------------------------------------------- Assessment and Plan  Chest pain EKG reassuring.  History is most consistent with costochondritis.  She can try anti-inflammatories  and/or heat to the area.  Travel advice encounter Recommend typhoid vaccine, will prescribe Vivotif.  Discussed that she needs to start ASAP. Flu vaccine given today.  She will have initial COVID-vaccine prior to travel. Start Malarone for malaria prophylaxis.  She is instructed to start this 1 to 2 days prior to travel and continue for an additional week when she returns.   Meds ordered this encounter  Medications  . atorvastatin (LIPITOR) 20 MG tablet    Sig: Take 1 tablet (20 mg total) by mouth daily.    Dispense:  90 tablet    Refill:  3  . typhoid (VIVOTIF) DR capsule    Sig: Take 1 capsule by mouth every other day. Take 1 week prior to travel    Dispense:  4 capsule    Refill:  0  . atovaquone-proguanil (MALARONE) 250-100 MG TABS tablet    Sig: Start 1 day prior to travel.  Take daily and continue for 1 additional week upon return.    Dispense:  90 tablet    Refill:  0    No follow-ups on file.    This visit occurred during the SARS-CoV-2 public health emergency.  Safety protocols were in place, including screening questions prior to the visit, additional usage of staff PPE, and extensive cleaning of exam room while observing appropriate contact time as indicated for disinfecting solutions.

## 2020-09-07 NOTE — Assessment & Plan Note (Signed)
EKG reassuring.  History is most consistent with costochondritis.  She can try anti-inflammatories and/or heat to the area.

## 2020-09-07 NOTE — Assessment & Plan Note (Signed)
Recommend typhoid vaccine, will prescribe Vivotif.  Discussed that she needs to start ASAP. Flu vaccine given today.  She will have initial COVID-vaccine prior to travel. Start Malarone for malaria prophylaxis.  She is instructed to start this 1 to 2 days prior to travel and continue for an additional week when she returns.

## 2020-09-07 NOTE — Patient Instructions (Signed)
In addition to prescribed medications I would recommend COVID vaccine. Have a safe trip.

## 2020-09-12 ENCOUNTER — Other Ambulatory Visit: Payer: Self-pay

## 2020-09-12 MED ORDER — ALBUTEROL SULFATE HFA 108 (90 BASE) MCG/ACT IN AERS: 2.0000 | INHALATION_SPRAY | Freq: Four times a day (QID) | RESPIRATORY_TRACT | 0 refills | Status: DC | PRN

## 2021-02-22 ENCOUNTER — Other Ambulatory Visit: Payer: Self-pay

## 2021-02-22 ENCOUNTER — Encounter: Payer: Self-pay | Admitting: Emergency Medicine

## 2021-02-22 ENCOUNTER — Emergency Department (INDEPENDENT_AMBULATORY_CARE_PROVIDER_SITE_OTHER)
Admission: EM | Admit: 2021-02-22 | Discharge: 2021-02-22 | Disposition: A | Discharge status: Home / Self Care | Payer: 59 | Source: Home / Self Care | Attending: Family Medicine | Admitting: Family Medicine

## 2021-02-22 DIAGNOSIS — M722 Plantar fascial fibromatosis: Secondary | ICD-10-CM

## 2021-02-22 DIAGNOSIS — R202 Paresthesia of skin: Secondary | ICD-10-CM

## 2021-02-22 DIAGNOSIS — R531 Weakness: Secondary | ICD-10-CM

## 2021-02-22 NOTE — ED Provider Notes (Signed)
[facility]    CSN: [id number] Arrival date & time: 02/22/21  1731      History   Chief Complaint Chief Complaint  Patient presents with   Foot Burn    HPI Jamie Lawson is a 45 y.o. female.   Patient presents with two complaints: While at work at about 9am today her right hand began "shaking," resolving spontaneously but recurring again at about 4pm, again resolving spontaneously.  She states that she had vague bilateral blurred vision today 3:30pm lasting 1.5 hours.  She is assymptomatic at present.  She notes that she tends to drink large quantities of water daily.  Her job is generally sedentary. She also complains of bilateral burning sensation in the plantar surfaces of both feet that became worse last night.  However, she admits that she has bilateral foot pain that has been present for several months.  The pain in her plantar surfaces is worse upon arising each morning, becoming somewhat better during the day.  She denies recent change in activities or new shoes. Patient denies family history of stroke.      Past Medical History:  Diagnosis Date   Allergy    Sickle cell trait [facility])     Patient Active Problem List   Diagnosis Date Noted   Chest pain 09/07/2020   Travel advice encounter 09/07/2020   COVID-19 07/03/2020   Well adult exam 10/08/2019   Environmental allergies 11/20/2018   Allergic reaction to peanut 11/20/2018   History of allergy to shellfish 11/20/2018   Ganglion cyst of dorsum of right wrist 11/20/2018   Absolute anemia 01/26/2018   Chronic bilateral thoracic back pain 01/26/2018    Past Surgical History:  Procedure Laterality Date   CESAREAN SECTION     x4   CESAREAN SECTION WITH BILATERAL TUBAL LIGATION      OB History     Gravida  4   Para  4   Term      Preterm      AB      Living         SAB      IAB      Ectopic      Multiple      Live Births               Home Medications    Prior  to Admission medications   Medication Sig Start Date End Date Taking? Authorizing Provider  albuterol (VENTOLIN HFA) 108 (90 Base) MCG/ACT inhaler Inhale 2 puffs into the lungs every 6 (six) hours as needed for wheezing or shortness of breath. 09/12/20   Everrett Coombe, DO  atorvastatin (LIPITOR) 20 MG tablet Take 1 tablet (20 mg total) by mouth daily. 09/07/20   Everrett Coombe, DO  diphenhydrAMINE (BENADRYL) 50 MG capsule Take 50 mg by mouth every 6 (six) hours as needed for allergies.    [provider]  famotidine (PEPCID) 20 MG tablet Take 1 tablet (20 mg total) by mouth 2 (two) times daily. 11/20/18   Carlis Stable, PA-C  levocetirizine (XYZAL) 5 MG tablet Take 5 mg by mouth every evening.    [provider]  montelukast (SINGULAIR) 10 MG tablet Take one tab by mouth at bedtime 05/11/18   Lattie Haw, MD    Family History Family History  Problem Relation Age of Onset   Healthy Mother    Healthy Father     Social History Social History   Tobacco Use  Smoking status: Never   Smokeless tobacco: Never  Vaping Use   Vaping Use: Never used  Substance Use Topics   Alcohol use: Never   Drug use: Never     Allergies   Patient has no known allergies.   Review of Systems Review of Systems  Constitutional:  Negative for activity change, appetite change, chills, diaphoresis, fatigue and fever.  HENT: Negative.    Eyes:  Positive for visual disturbance. Negative for photophobia, pain, discharge, redness and itching.  Respiratory: Negative.    Cardiovascular: Negative.   Gastrointestinal: Negative.   Endocrine: Negative.   Genitourinary: Negative.   Musculoskeletal:        Pain and tingling in plantar surfaces of both feet  Skin: Negative.   Neurological:  Positive for tremors. Negative for dizziness, seizures, syncope, facial asymmetry, speech difficulty, weakness, light-headedness, numbness and headaches.  Hematological:  Negative for  adenopathy.    Physical Exam Triage Vital Signs ED Triage Vitals  Enc Vitals Group     BP 02/22/21 1751 129/82     Pulse Rate 02/22/21 1751 80     Resp 02/22/21 1751 18     Temp 02/22/21 1751 98.3 F (36.8 C)     Temp Source 02/22/21 1751 Oral     SpO2 02/22/21 1751 100 %     Weight 02/22/21 1753 218 lb (98.9 kg)     Height 02/22/21 1753 5\' 7"  (1.702 m)     Head Circumference --      Peak Flow --      Pain Score 02/22/21 1752 8     Pain Loc --      Pain Edu? --      Excl. in GC? --    No data found.  Updated Vital Signs BP 129/82 (BP Location: Right Arm)   Pulse 80   Temp 98.3 F (36.8 C) (Oral)   Resp 18   Ht 5\' 7"  (1.702 m)   Wt 98.9 kg   LMP 02/08/2021 (Approximate)   SpO2 100%   BMI 34.14 kg/m   Visual Acuity Right Eye Distance: 20/25 Left Eye Distance: 20/25 Bilateral Distance: 20/25 (without correction)  Right Eye Near:   Left Eye Near:    Bilateral Near:     Physical Exam Nursing notes and Vital Signs reviewed. Appearance:  Patient appears stated age, and in no acute distress.  She is alert and oriented.   Eyes:  Pupils are equal, round, and reactive to light and accomodation.  Extraocular movement is intact.  Conjunctivae are not inflamed.  Fundi benign. Ears:  Canals normal.  Tympanic membranes normal.  Nose:  Normal turbinates.  No sinus tenderness.   Pharynx:  Normal Neck:  Supple.  No adenopathy or thyromegaly.  Carotids have normal upstrokes without bruits. Lungs:  Clear to auscultation.  Breath sounds are equal.  Moving air well. Heart:  Regular rate and rhythm without murmurs, rubs, or gallops.  Abdomen:  Nontender without masses or hepatosplenomegaly.  Bowel sounds are present.  No CVA or flank tenderness.  Extremities:  No edema.  Pedal pulses intact. Bilateral plantar surfaces are tender to palpation from midfoot to heel. Skin:  No rash present.  Neurologic:  Cranial nerves 2 through 12 are normal.  Patellar, achilles, and elbow reflexes  are normal.  Cerebellar function is intact (finger-to-nose and rapid alternating hand movement).  Gait and station are normal.  Grip strength symmetric bilaterally.  No pronator drift.  UC Treatments / Results  Labs (all labs  ordered are listed, but only abnormal results are displayed) Labs Reviewed  CBC WITH DIFFERENTIAL/PLATELET  COMPLETE METABOLIC PANEL WITH GFR    EKG   Radiology No results found.  Procedures Procedures (including critical care time)  Medications Ordered in UC Medications - No data to display  Initial Impression / Assessment and Plan / UC Course  I have reviewed the triage vital signs and the nursing notes.  Pertinent labs & imaging results that were available during my care of the patient were reviewed by me and considered in my medical decision making (see chart for details).  I reviewed her past chart records; note presence of hyperlipidemia.    Patient has normal neurologic exam today.  Patient has sickle cell trait, but not likely to have caused her symptoms; she had not been performing strenuous activities in a hot environment.  In fact, she states that she drinks large amounts of water daily.  Electrolyte imbalance (hyponatremia) could have been a factor. Concern also for possible TIA. CBC and CMP pending. Recommend follow-up with her PCP for further evaluation. Followup with Dr[facility] if plantar fasciitis not improving about two weeks.   Final Clinical Impressions(s) / UC Diagnoses   Final diagnoses:  Paresthesias in right hand  Weakness generalized  Bilateral plantar fasciitis     Discharge Instructions      Try decreasing your intake of water. Begin stretching exercises for plantar fasciitis.  Try wearing "night splints" on your feet/ankles at night. Put ice on the painful areas of your feet. To do this: Put ice in a plastic bag, or use a frozen bottle of water. Place a towel between your skin and  the bag or bottle. Roll the bottom of your foot over the bag or bottle. Do this for 20 minutes, 2-3 times a day. Wear athletic shoes that have air-sole or gel-sole cushions, or try soft shoe inserts that are designed for plantar fasciitis. Elevate your foot above the level of your heart while you are sitting or lying down.  If symptoms become significantly worse during the night or over the weekend, proceed to the local emergency room.    ED Prescriptions   None       Lattie Haw, MD 02/24/21 [phone]

## 2021-02-22 NOTE — ED Triage Notes (Signed)
Bilateral feet burning on the bottom started last night. Rt hand started shaking at 9am and 4pm. Vision was blurred while this was going on. Boss called EMS she refused to go to the hospital and got her husband to bring her here.

## 2021-02-22 NOTE — Discharge Instructions (Addendum)
Try decreasing your intake of water. Begin stretching exercises for plantar fasciitis.  Try wearing "night splints" on your feet/ankles at night. Put ice on the painful areas of your feet. To do this: Put ice in a plastic bag, or use a frozen bottle of water. Place a towel between your skin and the bag or bottle. Roll the bottom of your foot over the bag or bottle. Do this for 20 minutes, 2-3 times a day. Wear athletic shoes that have air-sole or gel-sole cushions, or try soft shoe inserts that are designed for plantar fasciitis. Elevate your foot above the level of your heart while you are sitting or lying down.  If symptoms become significantly worse during the night or over the weekend, proceed to the local emergency room.

## 2021-02-23 ENCOUNTER — Ambulatory Visit: Payer: 59 | Admitting: Family Medicine

## 2021-02-23 ENCOUNTER — Encounter: Payer: Self-pay | Admitting: Family Medicine

## 2021-02-23 DIAGNOSIS — M722 Plantar fascial fibromatosis: Secondary | ICD-10-CM

## 2021-02-23 LAB — CBC WITH DIFFERENTIAL/PLATELET
Absolute Monocytes: 481 cells/uL (ref 200–950)
Basophils Absolute: 41 cells/uL (ref 0–200)
Basophils Relative: 0.7 %
Eosinophils Absolute: 110 cells/uL (ref 15–500)
Eosinophils Relative: 1.9 %
HCT: 39.1 % (ref 35.0–45.0)
Hemoglobin: 13.1 g/dL (ref 11.7–15.5)
Lymphs Abs: 2430 cells/uL (ref 850–3900)
MCH: 28.1 pg (ref 27.0–33.0)
MCHC: 33.5 g/dL (ref 32.0–36.0)
MCV: 83.7 fL (ref 80.0–100.0)
MPV: 10.9 fL (ref 7.5–12.5)
Monocytes Relative: 8.3 %
Neutro Abs: 2738 cells/uL (ref 1500–7800)
Neutrophils Relative %: 47.2 %
Platelets: 371 10*3/uL (ref 140–400)
RBC: 4.67 10*6/uL (ref 3.80–5.10)
RDW: 13.7 % (ref 11.0–15.0)
Total Lymphocyte: 41.9 %
WBC: 5.8 10*3/uL (ref 3.8–10.8)

## 2021-02-23 LAB — COMPLETE METABOLIC PANEL WITH GFR
AG Ratio: 1.5 (calc) (ref 1.0–2.5)
ALT: 21 U/L (ref 6–29)
AST: 15 U/L (ref 10–35)
Albumin: 4.9 g/dL (ref 3.6–5.1)
Alkaline phosphatase (APISO): 65 U/L (ref 31–125)
BUN: 14 mg/dL (ref 7–25)
CO2: 25 mmol/L (ref 20–32)
Calcium: 11.1 mg/dL — ABNORMAL HIGH (ref 8.6–10.2)
Chloride: 103 mmol/L (ref 98–110)
Creat: 0.75 mg/dL (ref 0.50–1.10)
GFR, Est African American: 112 mL/min/{1.73_m2} (ref >=60)
GFR, Est Non African American: 96 mL/min/{1.73_m2} (ref >=60)
Globulin: 3.2 g/dL (calc) (ref 1.9–3.7)
Glucose, Bld: 94 mg/dL (ref 65–99)
Potassium: 4.2 mmol/L (ref 3.5–5.3)
Sodium: 140 mmol/L (ref 135–146)
Total Bilirubin: 0.5 mg/dL (ref 0.2–1.2)
Total Protein: 8.1 g/dL (ref 6.1–8.1)

## 2021-02-23 MED ORDER — MELOXICAM 15 MG PO TABS: 15.0000 mg | ORAL_TABLET | Freq: Every day | ORAL | 1 refills | Status: DC

## 2021-02-23 NOTE — Progress Notes (Signed)
Acute Office Visit  Subjective:    Patient ID: Jamie Lawson, female    DOB: 24-Nov-1975, 45 y.o.   MRN: [id number]  Chief Complaint  Patient presents with   Follow-up    HPI Patient is in today for urgent care f/u for hypercalcemia.  Patient was seen at urgent care last night after having an episode at work where she got clammy and was having vision changes and shaking hands. Her coworkers called EMS and her blood sugar was low, but cannot remember what it was. They gave her soda and instructed her to go the ED or urgent care. By the time she got there she felt fine. CBC and CMP were ordered - normal apart from hypercalcemia. Reports she was busy the past few days and may have missed a meal or two.   While she was at [facility] she also reported bilateral pain to soles of feet and was diagnosed with plantar fasciitis and given exercises/stretches to do at home.         Past Medical History:  Diagnosis Date   Allergy    Sickle cell trait (HCC)     Past Surgical History:  Procedure Laterality Date   CESAREAN SECTION     x4   CESAREAN SECTION WITH BILATERAL TUBAL LIGATION      Family History  Problem Relation Age of Onset   Healthy Mother    Healthy Father     Social History   Socioeconomic History   Marital status: Married    Spouse name: Not on file   Number of children: Not on file   Years of education: Not on file   Highest education level: Not on file  Occupational History   Not on file  Tobacco Use   Smoking status: Never   Smokeless tobacco: Never  Vaping Use   Vaping Use: Never used  Substance and Sexual Activity   Alcohol use: Never   Drug use: Never   Sexual activity: Yes    Birth control/protection: Surgical  Other Topics Concern   Not on file  Social History Narrative   Not on file   Social Determinants of Health   Financial Resource Strain: Not on file  Food Insecurity: Not on file  Transportation Needs: Not on file  Physical  Activity: Not on file  Stress: Not on file  Social Connections: Not on file  Intimate Partner Violence: Not on file    Outpatient Medications Prior to Visit  Medication Sig Dispense Refill   albuterol (VENTOLIN HFA) 108 (90 Base) MCG/ACT inhaler Inhale 2 puffs into the lungs every 6 (six) hours as needed for wheezing or shortness of breath. 8 g 0   atorvastatin (LIPITOR) 20 MG tablet Take 1 tablet (20 mg total) by mouth daily. 90 tablet 3   diphenhydrAMINE (BENADRYL) 50 MG capsule Take 50 mg by mouth every 6 (six) hours as needed for allergies.     famotidine (PEPCID) 20 MG tablet Take 1 tablet (20 mg total) by mouth 2 (two) times daily.     levocetirizine (XYZAL) 5 MG tablet Take 5 mg by mouth every evening.     montelukast (SINGULAIR) 10 MG tablet Take one tab by mouth at bedtime 30 tablet 1   No facility-administered medications prior to visit.    No Known Allergies  Review of Systems All review of systems negative except what is listed in the HPI     Objective:    Physical Exam Vitals reviewed.  Constitutional:  Appearance: Normal appearance.  HENT:     Head: Normocephalic and atraumatic.  Cardiovascular:     Rate and Rhythm: Normal rate and regular rhythm.     Pulses: Normal pulses.     Heart sounds: Normal heart sounds.  Pulmonary:     Effort: Pulmonary effort is normal.     Breath sounds: Normal breath sounds.  Musculoskeletal:     Cervical back: Normal range of motion and neck supple. No rigidity or tenderness.  Lymphadenopathy:     Cervical: No cervical adenopathy.  Skin:    General: Skin is warm and dry.     Findings: No rash.  Neurological:     Mental Status: She is alert and oriented to person, place, and time.  Psychiatric:        Mood and Affect: Mood normal.        Behavior: Behavior normal.        Thought Content: Thought content normal.        Judgment: Judgment normal.    BP 135/84   Pulse 77   Temp 97.9 F (36.6 C)   Resp 16   LMP  02/08/2021 (Approximate)   SpO2 97%  Wt Readings from Last 3 Encounters:  02/22/21 218 lb (98.9 kg)  09/07/20 214 lb 3.7 oz (97.2 kg)  10/08/19 212 lb (96.2 kg)    Health Maintenance Due  Topic Date Due   COVID-19 Vaccine (1) Never done   HIV Screening  Never done   Hepatitis C Screening  Never done   TETANUS/TDAP  Never done   PAP SMEAR-Modifier  03/01/2005   COLONOSCOPY (Pts 45-79yrs Insurance coverage will need to be confirmed)  Never done    There are no preventive care reminders to display for this patient.   Lab Results  Component Value Date   TSH 1.33 11/27/2017   Lab Results  Component Value Date   WBC 5.8 02/22/2021   HGB 13.1 02/22/2021   HCT 39.1 02/22/2021   MCV 83.7 02/22/2021   PLT 371 02/22/2021   Lab Results  Component Value Date   NA 140 02/22/2021   K 4.2 02/22/2021   CO2 25 02/22/2021   GLUCOSE 94 02/22/2021   BUN 14 02/22/2021   CREATININE 0.75 02/22/2021   BILITOT 0.5 02/22/2021   AST 15 02/22/2021   ALT 21 02/22/2021   PROT 8.1 02/22/2021   CALCIUM 11.1 (H) 02/22/2021   Lab Results  Component Value Date   CHOL 249 (H) 10/08/2019   Lab Results  Component Value Date   HDL 48 (L) 10/08/2019   Lab Results  Component Value Date   LDLCALC 171 (H) 10/08/2019   Lab Results  Component Value Date   TRIG 155 (H) 10/08/2019   Lab Results  Component Value Date   CHOLHDL 5.2 (H) 10/08/2019   No results found for: HGBA1C     Assessment & Plan:   1. Hypercalcemia When corrected for albumin, Ca is 10.4, only mild elevated. Would like to recheck in 1 week before pursuing further workup. Patient aware of signs/symptoms requiring further/urgent evaluation.  - COMPLETE METABOLIC PANEL WITH GFR; Future  2. Plantar fasciitis, bilateral Not getting adequate relief with ibuprofen and does not like having to take multiple times per day, will try meloxicam. Recommended doing the exercises/stretches provided on handout by UC and using frozen  water bottle for ice/pain mgt. Also recommend she try not to go barefoot so frequently - needs to at least be in house shoes/sandals.  If no improvement after 4-6 weeks of conservative therapy, schedule appointment with sports med, Dr. Karie Schwalbe, to discuss further options including injections.   - meloxicam (MOBIC) 15 MG tablet; Take 1 tablet (15 mg total) by mouth daily.  Dispense: 30 tablet; Refill: 1  Follow-up with lab results after coming by next week. Follow-up as needed.    Clayborne Dana, NP

## 2021-02-23 NOTE — Patient Instructions (Signed)
Labs next week to recheck calcium levels. Meloxicam, antiflammatory for feet. Stretches, ice, try not to go barefoot F/u with Dr. In 4-6 weeks if not improving to discuss injections.

## 2021-03-01 ENCOUNTER — Other Ambulatory Visit: Payer: Self-pay | Admitting: *Deleted

## 2021-03-02 LAB — COMPLETE METABOLIC PANEL WITH GFR
AG Ratio: 1.7 (calc) (ref 1.0–2.5)
ALT: 19 U/L (ref 6–29)
AST: 16 U/L (ref 10–35)
Albumin: 4.5 g/dL (ref 3.6–5.1)
Alkaline phosphatase (APISO): 55 U/L (ref 31–125)
BUN: 15 mg/dL (ref 7–25)
CO2: 25 mmol/L (ref 20–32)
Calcium: 9.7 mg/dL (ref 8.6–10.2)
Chloride: 105 mmol/L (ref 98–110)
Creat: 0.9 mg/dL (ref 0.50–1.10)
GFR, Est African American: 89 mL/min/{1.73_m2} (ref >=60)
GFR, Est Non African American: 77 mL/min/{1.73_m2} (ref >=60)
Globulin: 2.7 g/dL (calc) (ref 1.9–3.7)
Glucose, Bld: 80 mg/dL (ref 65–99)
Potassium: 4.4 mmol/L (ref 3.5–5.3)
Sodium: 140 mmol/L (ref 135–146)
Total Bilirubin: 0.4 mg/dL (ref 0.2–1.2)
Total Protein: 7.2 g/dL (ref 6.1–8.1)

## 2021-03-02 NOTE — Progress Notes (Signed)
MyChart message sent: Your repeat lab work is perfect! Hope you are feeling back to normal. Let us know if you need anything! Lollie Marrow Reola Calkins, DNP, FNP-C

## 2021-07-23 ENCOUNTER — Encounter: Payer: Self-pay | Admitting: Physician Assistant

## 2021-07-23 ENCOUNTER — Telehealth (INDEPENDENT_AMBULATORY_CARE_PROVIDER_SITE_OTHER): Payer: Self-pay | Admitting: Physician Assistant

## 2021-07-23 VITALS — Temp 104.0°F

## 2021-07-23 DIAGNOSIS — R430 Anosmia: Secondary | ICD-10-CM

## 2021-07-23 DIAGNOSIS — Z20822 Contact with and (suspected) exposure to covid-19: Secondary | ICD-10-CM | POA: Insufficient documentation

## 2021-07-23 DIAGNOSIS — R051 Acute cough: Secondary | ICD-10-CM

## 2021-07-23 DIAGNOSIS — U071 COVID-19: Secondary | ICD-10-CM

## 2021-07-23 MED ORDER — HYDROCOD POLST-CPM POLST ER 10-8 MG/5ML PO SUER: 5.0000 mL | Freq: Two times a day (BID) | ORAL | 0 refills | Status: DC | PRN

## 2021-07-23 MED ORDER — ALBUTEROL SULFATE HFA 108 (90 BASE) MCG/ACT IN AERS: 2.0000 | INHALATION_SPRAY | Freq: Four times a day (QID) | RESPIRATORY_TRACT | 0 refills | Status: DC | PRN

## 2021-07-23 MED ORDER — BENZONATATE 200 MG PO CAPS: 200.0000 mg | ORAL_CAPSULE | Freq: Two times a day (BID) | ORAL | 0 refills | Status: DC | PRN

## 2021-07-23 NOTE — Progress Notes (Signed)
Yesterday fever was 104. Today she is having body aches and her chest feels tight. She reports no taste or smell started Friday. Headaches as well. She has not COVID tested because she didn't want to leave kids home alone.

## 2021-07-23 NOTE — Progress Notes (Signed)
Patient ID: Jamie Lawson, female   DOB: 26-Jan-1976, 45 y.o.   MRN: [id number]  .Marland KitchenVirtual Visit via Video Note  I connected with Jamie Lawson on 07/23/21 at  3:00 PM EST by a video enabled telemedicine application and verified that I am speaking with the correct person using two identifiers.  Location: Patient: home in bed Provider: clinic  .Marland KitchenParticipating in visit:  Patient: Jamie Lawson Provider: Tandy Gaw PA-C Provider in training: Asher Muir PA-S   I discussed the limitations of evaluation and management by telemedicine and the availability of in person appointments. The patient expressed understanding and agreed to proceed.  History of Present Illness: Patient is a 45 year old female who calls into the clinic with fatigue, loss of taste and smell, productive cough, chest tightness since Friday.  She was exposed to COVID on Thursday.  She was living in the same house with a relative staying with her.  She is ran a 104 temperature today.  She is having lots of body aches and feels terrible.  She is not really doing anything except Tylenol and ibuprofen to treat aches and pains.  She has had COVID once before and felt very similar.  She has not tested for COVID because she does not have a car.  She does not want to leave her kids along at the house to come into the office to be tested.  She denies any significant shortness of breath, bilateral leg swelling or pain.  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 11/20/2018   History of allergy to shellfish 11/20/2018   Ganglion cyst of dorsum of right wrist 11/20/2018   Well adult exam 10/08/2019   COVID-19 07/03/2020   Chest pain 09/07/2020   Travel advice encounter 09/07/2020   Exposure to COVID-19 virus 07/23/2021   Resolved Ambulatory Problems    Diagnosis Date  Noted   No Resolved Ambulatory Problems   Past Medical History:  Diagnosis Date   Allergy    Sickle cell trait (HCC)     Observations/Objective: No acute distress Productive barking cough Laying in bed and lethargic No labored breathing Tearful  .Marland Kitchen Today's Vitals   07/23/21 1502  Temp: (!) 104 F (40 C)  TempSrc: Oral   There is no height or weight on file to calculate BMI.    Assessment and Plan: Marland KitchenMarland KitchenDiagnoses and all orders for this visit:  COVID-19 -     albuterol (VENTOLIN HFA) 108 (90 Base) MCG/ACT inhaler; Inhale 2 puffs into the lungs every 6 (six) hours as needed for wheezing or shortness of breath. -     benzonatate (TESSALON) 200 MG capsule; Take 1 capsule (200 mg total) by mouth 2 (two) times daily as needed for cough. -     chlorpheniramine-HYDROcodone (TUSSIONEX) 10-8 MG/5ML SUER; Take 5 mLs by mouth every 12 (twelve) hours as needed.  Exposure to COVID-19 virus  Acute cough -     albuterol (VENTOLIN HFA) 108 (90 Base) MCG/ACT inhaler; Inhale 2 puffs into the lungs every 6 (six) hours as needed for wheezing or shortness of breath. -     benzonatate (TESSALON) 200 MG capsule; Take 1 capsule (200 mg total) by mouth 2 (two) times daily as needed for cough. -     chlorpheniramine-HYDROcodone (TUSSIONEX) 10-8 MG/5ML SUER; Take 5 mLs by mouth every 12 (twelve) hours as needed.  Sense  of smell lost -     albuterol (VENTOLIN HFA) 108 (90 Base) MCG/ACT inhaler; Inhale 2 puffs into the lungs every 6 (six) hours as needed for wheezing or shortness of breath. -     benzonatate (TESSALON) 200 MG capsule; Take 1 capsule (200 mg total) by mouth 2 (two) times daily as needed for cough. -     chlorpheniramine-HYDROcodone (TUSSIONEX) 10-8 MG/5ML SUER; Take 5 mLs by mouth every 12 (twelve) hours as needed.  Would like to test for covid or flu but patient does not have car. She does have known exposure to covid and lost taste and smell, likely covid.  Low risk for  complications of covid. Not candidate for paxlovid.  Discussed symptomatic care.  Discussed quarantine. Written note for work.  Discussed red flags and when to seek more care.  Tessalon, tussionex, albuterol given. Continue ibuprofen/tylenol and alertnate to keep fever down.  Good deep breathes every hour.  Follow up as needed with any concerns.   Follow Up Instructions:    I discussed the assessment and treatment plan with the patient. The patient was provided an opportunity to ask questions and all were answered. The patient agreed with the plan and demonstrated an understanding of the instructions.   The patient was advised to call back or seek an in-person evaluation if the symptoms worsen or if the condition fails to improve as anticipated.  Tandy Gaw, PA-C  [email or web address]

## 2021-07-23 NOTE — Patient Instructions (Signed)
COVID-19: Quarantine and Isolation °Quarantine °If you were exposed °Quarantine and stay away from others when you have been in close contact with someone who has COVID-19. °Isolate °If you are sick or test positive °Isolate when you are sick or when you have COVID-19, even if you don't have symptoms. °When to stay home °Calculating quarantine °The date of your exposure is considered day 0. Day 1 is the first full day after your last contact with a person who has had COVID-19. Stay home and away from other people for at least 5 days. Learn why CDC updated guidance for the general public. °IF YOU were exposed to COVID-19 and are NOT  °up to dateIF YOU were exposed to COVID-19 and are NOT on COVID-19 vaccinations °Quarantine for at least 5 days °Stay home °Stay home and quarantine for at least 5 full days. °Wear a well-fitting mask if you must be around others in your home. °Do not travel. °Get tested °Even if you don't develop symptoms, get tested at least 5 days after you last had close contact with someone with COVID-19. °After quarantine °Watch for symptoms °Watch for symptoms until 10 days after you last had close contact with someone with COVID-19. °Avoid travel °It is best to avoid travel until a full 10 days after you last had close contact with someone with COVID-19. °If you develop symptoms °Isolate immediately and get tested. Continue to stay home until you know the results. Wear a well-fitting mask around others. °Take precautions until day 10 °Wear a well-fitting mask °Wear a well-fitting mask for 10 full days any time you are around others inside your home or in public. Do not go to places where you are unable to wear a well-fitting mask. °If you must travel during days 6-10, take precautions. °Avoid being around people who are more likely to get very sick from COVID-19. °IF YOU were exposed to COVID-19 and are  °up to dateIF YOU were exposed to COVID-19 and are on COVID-19 vaccinations °No  quarantine °You do not need to stay home unless you develop symptoms. °Get tested °Even if you don't develop symptoms, get tested at least 5 days after you last had close contact with someone with COVID-19. °Watch for symptoms °Watch for symptoms until 10 days after you last had close contact with someone with COVID-19. °If you develop symptoms °Isolate immediately and get tested. Continue to stay home until you know the results. Wear a well-fitting mask around others. °Take precautions until day 10 °Wear a well-fitting mask °Wear a well-fitting mask for 10 full days any time you are around others inside your home or in public. Do not go to places where you are unable to wear a well-fitting mask. °Take precautions if traveling °Avoid being around people who are more likely to get very sick from COVID-19. °IF YOU were exposed to COVID-19 and had confirmed COVID-19 within the past 90 days (you tested positive using a viral test) °No quarantine °You do not need to stay home unless you develop symptoms. °Watch for symptoms °Watch for symptoms until 10 days after you last had close contact with someone with COVID-19. °If you develop symptoms °Isolate immediately and get tested. Continue to stay home until you know the results. Wear a well-fitting mask around others. °Take precautions until day 10 °Wear a well-fitting mask °Wear a well-fitting mask for 10 full days any time you are around others inside your home or in public. Do not go to places where you are   unable to wear a well-fitting mask. °Take precautions if traveling °Avoid being around people who are more likely to get very sick from COVID-19. °Calculating isolation °Day 0 is your first day of symptoms or a positive viral test. Day 1 is the first full day after your symptoms developed or your test specimen was collected. If you have COVID-19 or have symptoms, isolate for at least 5 days. °IF YOU tested positive for COVID-19 or have symptoms, regardless of  vaccination status °Stay home for at least 5 days °Stay home for 5 days and isolate from others in your home. °Wear a well-fitting mask if you must be around others in your home. °Do not travel. °Ending isolation if you had symptoms °End isolation after 5 full days if you are fever-free for 24 hours (without the use of fever-reducing medication) and your symptoms are improving. °Ending isolation if you did NOT have symptoms °End isolation after at least 5 full days after your positive test. °If you got very sick from COVID-19 or have a weakened immune system °You should isolate for at least 10 days. Consult your doctor before ending isolation. °Take precautions until day 10 °Wear a well-fitting mask °Wear a well-fitting mask for 10 full days any time you are around others inside your home or in public. Do not go to places where you are unable to wear a well-fitting mask. °Do not travel °Do not travel until a full 10 days after your symptoms started or the date your positive test was taken if you had no symptoms. °Avoid being around people who are more likely to get very sick from COVID-19. °Definitions °Exposure °Contact with someone infected with SARS-CoV-2, the virus that causes COVID-19, in a way that increases the likelihood of getting infected with the virus. °Close contact °A close contact is someone who was less than 6 feet away from an infected person (laboratory-confirmed or a clinical diagnosis) for a cumulative total of 15 minutes or more over a 24-hour period. For example, three individual 5-minute exposures for a total of 15 minutes. People who are exposed to someone with COVID-19 after they completed at least 5 days of isolation are not considered close contacts. °Quarantine °Quarantine is a strategy used to prevent transmission of COVID-19 by keeping people who have been in close contact with someone with COVID-19 apart from others. °Who does not need to quarantine? °If you had close contact with  someone with COVID-19 and you are in one of the following groups, you do not need to quarantine. °You are up to date with your COVID-19 vaccines. °You had confirmed COVID-19 within the last 90 days (meaning you tested positive using a viral test). °If you are up to date with COVID-19 vaccines, you should wear a well-fitting mask around others for 10 days from the date of your last close contact with someone with COVID-19 (the date of last close contact is considered day 0). Get tested at least 5 days after you last had close contact with someone with COVID-19. If you test positive or develop COVID-19 symptoms, isolate from other people and follow recommendations in the Isolation section below. If you tested positive for COVID-19 with a viral test within the previous 90 days and subsequently recovered and remain without COVID-19 symptoms, you do not need to quarantine or get tested after close contact. You should wear a well-fitting mask around others for 10 days from the date of your last close contact with someone with COVID-19 (the date of last   close contact is considered day 0). If you have COVID-19 symptoms, get tested and isolate from other people and follow recommendations in the Isolation section below. °Who should quarantine? °If you come into close contact with someone with COVID-19, you should quarantine if you are not up to date on COVID-19 vaccines. This includes people who are not vaccinated. °What to do for quarantine °Stay home and away from other people for at least 5 days (day 0 through day 5) after your last contact with a person who has COVID-19. The date of your exposure is considered day 0. Wear a well-fitting mask when around others at home, if possible. °For 10 days after your last close contact with someone with COVID-19, watch for fever (100.4°F or greater), cough, shortness of breath, or other COVID-19 symptoms. °If you develop symptoms, get tested immediately and isolate until you receive  your test results. If you test positive, follow isolation recommendations. °If you do not develop symptoms, get tested at least 5 days after you last had close contact with someone with COVID-19. °If you test negative, you can leave your home, but continue to wear a well-fitting mask when around others at home and in public until 10 days after your last close contact with someone with COVID-19. °If you test positive, you should isolate for at least 5 days from the date of your positive test (if you do not have symptoms). If you do develop COVID-19 symptoms, isolate for at least 5 days from the date your symptoms began (the date the symptoms started is day 0). Follow recommendations in the isolation section below. °If you are unable to get a test 5 days after last close contact with someone with COVID-19, you can leave your home after day 5 if you have been without COVID-19 symptoms throughout the 5-day period. Wear a well-fitting mask for 10 days after your date of last close contact when around others at home and in public. °Avoid people who are have weakened immune systems or are more likely to get very sick from COVID-19, and nursing homes and other high-risk settings, until after at least 10 days. °If possible, stay away from people you live with, especially people who are at higher risk for getting very sick from COVID-19, as well as others outside your home throughout the full 10 days after your last close contact with someone with COVID-19. °If you are unable to quarantine, you should wear a well-fitting mask for 10 days when around others at home and in public. °If you are unable to wear a mask when around others, you should continue to quarantine for 10 days. Avoid people who have weakened immune systems or are more likely to get very sick from COVID-19, and nursing homes and other high-risk settings, until after at least 10 days. °See additional information about travel. °Do not go to places where you are  unable to wear a mask, such as restaurants and some gyms, and avoid eating around others at home and at work until after 10 days after your last close contact with someone with COVID-19. °After quarantine °Watch for symptoms until 10 days after your last close contact with someone with COVID-19. °If you have symptoms, isolate immediately and get tested. °Quarantine in high-risk congregate settings °In certain congregate settings that have high risk of secondary transmission (such as correctional and detention facilities, homeless shelters, or cruise ships), CDC recommends a 10-day quarantine for residents, regardless of vaccination and booster status. During periods of critical staffing   shortages, facilities may consider shortening the quarantine period for staff to ensure continuity of operations. Decisions to shorten quarantine in these settings should be made in consultation with state, local, tribal, or territorial health departments and should take into consideration the context and characteristics of the facility. CDC's setting-specific guidance provides additional recommendations for these settings. °Isolation °Isolation is used to separate people with confirmed or suspected COVID-19 from those without COVID-19. People who are in isolation should stay home until it's safe for them to be around others. At home, anyone sick or infected should separate from others, or wear a well-fitting mask when they need to be around others. People in isolation should stay in a specific "sick room" or area and use a separate bathroom if available. Everyone who has presumed or confirmed COVID-19 should stay home and isolate from other people for at least 5 full days (day 0 is the first day of symptoms or the date of the day of the positive viral test for asymptomatic persons). They should wear a mask when around others at home and in public for an additional 5 days. People who are confirmed to have COVID-19 or are showing  symptoms of COVID-19 need to isolate regardless of their vaccination status. This includes: °People who have a positive viral test for COVID-19, regardless of whether or not they have symptoms. °People with symptoms of COVID-19, including people who are awaiting test results or have not been tested. People with symptoms should isolate even if they do not know if they have been in close contact with someone with COVID-19. °What to do for isolation °Monitor your symptoms. If you have an emergency warning sign (including trouble breathing), seek emergency medical care immediately. °Stay in a separate room from other household members, if possible. °Use a separate bathroom, if possible. °Take steps to improve ventilation at home, if possible. °Avoid contact with other members of the household and pets. °Don't share personal household items, like cups, towels, and utensils. °Wear a well-fitting mask when you need to be around other people. °Learn more about what to do if you are sick and how to notify your contacts. °Ending isolation for people who had COVID-19 and had symptoms °If you had COVID-19 and had symptoms, isolate for at least 5 days. To calculate your 5-day isolation period, day 0 is your first day of symptoms. Day 1 is the first full day after your symptoms developed. You can leave isolation after 5 full days. °You can end isolation after 5 full days if you are fever-free for 24 hours without the use of fever-reducing medication and your other symptoms have improved (Loss of taste and smell may persist for weeks or months after recovery and need not delay the end of isolation). °You should continue to wear a well-fitting mask around others at home and in public for 5 additional days (day 6 through day 10) after the end of your 5-day isolation period. If you are unable to wear a mask when around others, you should continue to isolate for a full 10 days. Avoid people who have weakened immune systems or are more  likely to get very sick from COVID-19, and nursing homes and other high-risk settings, until after at least 10 days. °If you continue to have fever or your other symptoms have not improved after 5 days of isolation, you should wait to end your isolation until you are fever-free for 24 hours without the use of fever-reducing medication and your other symptoms have improved.   Continue to wear a well-fitting mask through day 10. Contact your healthcare provider if you have questions. °See additional information about travel. °Do not go to places where you are unable to wear a mask, such as restaurants and some gyms, and avoid eating around others at home and at work until a full 10 days after your first day of symptoms. °If an individual has access to a test and wants to test, the best approach is to use an antigen test1 towards the end of the 5-day isolation period. Collect the test sample only if you are fever-free for 24 hours without the use of fever-reducing medication and your other symptoms have improved (loss of taste and smell may persist for weeks or months after recovery and need not delay the end of isolation). If your test result is positive, you should continue to isolate until day 10. If your test result is negative, you can end isolation, but continue to wear a well-fitting mask around others at home and in public until day 10. Follow additional recommendations for masking and avoiding travel as described above. °1As noted in the labeling for authorized over-the counter antigen tests: Negative results should be treated as presumptive. Negative results do not rule out SARS-CoV-2 infection and should not be used as the sole basis for treatment or patient management decisions, including infection control decisions. To improve results, antigen tests should be used twice over a three-day period with at least 24 hours and no more than 48 hours between tests. °Note that these recommendations on ending isolation  do not apply to people who are moderately ill or very sick from COVID-19 or have weakened immune systems. See section below for recommendations for when to end isolation for these groups. °Ending isolation for people who tested positive for COVID-19 but had no symptoms °If you test positive for COVID-19 and never develop symptoms, isolate for at least 5 days. Day 0 is the day of your positive viral test (based on the date you were tested) and day 1 is the first full day after the specimen was collected for your positive test. You can leave isolation after 5 full days. °If you continue to have no symptoms, you can end isolation after at least 5 days. °You should continue to wear a well-fitting mask around others at home and in public until day 10 (day 6 through day 10). If you are unable to wear a mask when around others, you should continue to isolate for 10 days. Avoid people who have weakened immune systems or are more likely to get very sick from COVID-19, and nursing homes and other high-risk settings, until after at least 10 days. °If you develop symptoms after testing positive, your 5-day isolation period should start over. Day 0 is your first day of symptoms. Follow the recommendations above for ending isolation for people who had COVID-19 and had symptoms. °See additional information about travel. °Do not go to places where you are unable to wear a mask, such as restaurants and some gyms, and avoid eating around others at home and at work until 10 days after the day of your positive test. °If an individual has access to a test and wants to test, the best approach is to use an antigen test1 towards the end of the 5-day isolation period. If your test result is positive, you should continue to isolate until day 10. If your test result is positive, you can also choose to test daily and if your test result   is negative, you can end isolation, but continue to wear a well-fitting mask around others at home and in  public until day 10. Follow additional recommendations for masking and avoiding travel as described above. °1As noted in the labeling for authorized over-the counter antigen tests: Negative results should be treated as presumptive. Negative results do not rule out SARS-CoV-2 infection and should not be used as the sole basis for treatment or patient management decisions, including infection control decisions. To improve results, antigen tests should be used twice over a three-day period with at least 24 hours and no more than 48 hours between tests. °Ending isolation for people who were moderately or very sick from COVID-19 or have a weakened immune system °People who are moderately ill from COVID-19 (experiencing symptoms that affect the lungs like shortness of breath or difficulty breathing) should isolate for 10 days and follow all other isolation precautions. To calculate your 10-day isolation period, day 0 is your first day of symptoms. Day 1 is the first full day after your symptoms developed. If you are unsure if your symptoms are moderate, talk to a healthcare provider for further guidance. °People who are very sick from COVID-19 (this means people who were hospitalized or required intensive care or ventilation support) and people who have weakened immune systems might need to isolate at home longer. They may also require testing with a viral test to determine when they can be around others. CDC recommends an isolation period of at least 10 and up to 20 days for people who were very sick from COVID-19 and for people with weakened immune systems. Consult with your healthcare provider about when you can resume being around other people. If you are unsure if your symptoms are severe or if you have a weakened immune system, talk to a healthcare provider for further guidance. °People who have a weakened immune system should talk to their healthcare provider about the potential for reduced immune responses to  COVID-19 vaccines and the need to continue to follow current prevention measures (including wearing a well-fitting mask and avoiding crowds and poorly ventilated indoor spaces) to protect themselves against COVID-19 until advised otherwise by their healthcare provider. Close contacts of immunocompromised people--including household members--should also be encouraged to receive all recommended COVID-19 vaccine doses to help protect these people. °Isolation in high-risk congregate settings °In certain high-risk congregate settings that have high risk of secondary transmission and where it is not feasible to cohort people (such as correctional and detention facilities, homeless shelters, and cruise ships), CDC recommends a 10-day isolation period for residents. During periods of critical staffing shortages, facilities may consider shortening the isolation period for staff to ensure continuity of operations. Decisions to shorten isolation in these settings should be made in consultation with state, local, tribal, or territorial health departments and should take into consideration the context and characteristics of the facility. CDC's setting-specific guidance provides additional recommendations for these settings. °This CDC guidance is meant to supplement--not replace--any federal, state, local, territorial, or tribal health and safety laws, rules, and regulations. °Recommendations for specific settings °These recommendations do not apply to healthcare professionals. For guidance specific to these settings, see °Healthcare professionals: Interim Guidance for Managing Healthcare Personnel with SARS-CoV-2 Infection or Exposure to SARS-CoV-2 °Patients, residents, and visitors to healthcare settings: Interim Infection Prevention and Control Recommendations for Healthcare Personnel During the Coronavirus Disease 2019 (COVID-19) Pandemic °Additional setting-specific guidance and recommendations are available. °These  recommendations on quarantine and isolation do apply to K[employer]12 School   settings. Additional guidance is available here: Overview of COVID-19 Quarantine for K-12 Schools °Travelers: Travel information and recommendations °Congregate facilities and other settings: guidance pages for community, work, and school settings °Ongoing COVID-19 exposure FAQs °I live with someone with COVID-19, but I cannot be separated from them. How do we manage quarantine in this situation? °It is very important for people with COVID-19 to remain apart from other people, if possible, even if they are living together. If separation of the person with COVID-19 from others that they live with is not possible, the other people that they live with will have ongoing exposure, meaning they will be repeatedly exposed until that person is no longer able to spread the virus to other people. In this situation, there are precautions you can take to limit the spread of COVID-19: °The person with COVID-19 and everyone they live with should wear a well-fitting mask inside the home. °If possible, one person should care for the person with COVID-19 to limit the number of people who are in close contact with the infected person. °Take steps to protect yourself and others to reduce transmission in the home: °Quarantine if you are not up to date with your COVID-19 vaccines. °Isolate if you are sick or tested positive for COVID-19, even if you don't have symptoms. °Learn more about the public health recommendations for testing, mask use and quarantine of close contacts, like yourself, who have ongoing exposure. These recommendations differ depending on your vaccination status. °What should I do if I have ongoing exposure to COVID-19 from someone I live with? °Recommendations for this situation depend on your vaccination status: °If you are not up to date on COVID-19 vaccines and have ongoing exposure to COVID-19, you should: °Begin quarantine immediately and  continue to quarantine throughout the isolation period of the person with COVID-19. °Continue to quarantine for an additional 5 days starting the day after the end of isolation for the person with COVID-19. °Get tested at least 5 days after the end of isolation of the infected person that lives with them. °If you test negative, you can leave the home but should continue to wear a well-fitting mask when around others at home and in public until 10 days after the end of isolation for the person with COVID-19. °Isolate immediately if you develop symptoms of COVID-19 or test positive. °If you are up to date with COVID-19 vaccines and have ongoing exposure to COVID-19, you should: °Get tested at least 5 days after your first exposure. A person with COVID-19 is considered infectious starting 2 days before they develop symptoms, or 2 days before the date of their positive test if they do not have symptoms. °Get tested again at least 5 days after the end of isolation for the person with COVID-19. °Wear a well-fitting mask when you are around the person with COVID-19, and do this throughout their isolation period. °Wear a well-fitting mask around others for 10 days after the infected person's isolation period ends. °Isolate immediately if you develop symptoms of COVID-19 or test positive. °What should I do if multiple people I live with test positive for COVID-19 at different times? °Recommendations for this situation depend on your vaccination status: °If you are not up to date with your COVID-19 vaccines, you should: °Quarantine throughout the isolation period of any infected person that you live with. °Continue to quarantine until 5 days after the end of isolation date for the most recently infected person that lives with you. For example, if   the last day of isolation of the person most recently infected with COVID-19 was June 30, the new 5-day quarantine period starts on July 1. °Get tested at least 5 days after the end  of isolation for the most recently infected person that lives with you. °Wear a well-fitting mask when you are around any person with COVID-19 while that person is in isolation. °Wear a well-fitting mask when you are around other people until 10 days after your last close contact. °Isolate immediately if you develop symptoms of COVID-19 or test positive. °If you are up to date with your COVID-19 vaccines, you should: °Get tested at least 5 days after your first exposure. A person with COVID-19 is considered infectious starting 2 days before they developed symptoms, or 2 days before the date of their positive test if they do not have symptoms. °Get tested again at least 5 days after the end of isolation for the most recently infected person that lives with you. °Wear a well-fitting mask when you are around any person with COVID-19 while that person is in isolation. °Wear a well-fitting mask around others for 10 days after the end of isolation for the most recently infected person that lives with you. For example, if the last day of isolation for the person most recently infected with COVID-19 was June 30, the new 10-day period to wear a well-fitting mask indoors in public starts on July 1. °Isolate immediately if you develop symptoms of COVID-19 or test positive. °I had COVID-19 and completed isolation. Do I have to quarantine or get tested if someone I live with gets COVID-19 shortly after I completed isolation? °No. If you recently completed isolation and someone that lives with you tests positive for the virus that causes COVID-19 shortly after the end of your isolation period, you do not have to quarantine or get tested as long as you do not develop new symptoms. Once all of the people that live together have completed isolation or quarantine, refer to the guidance below for new exposures to COVID-19. °If you had COVID-19 in the previous 90 days and then came into close contact with someone with COVID-19, you do  not have to quarantine or get tested if you do not have symptoms. But you should: °Wear a well-fitting mask indoors in public for 10 days after your last close contact. °Monitor for COVID-19 symptoms for 10 days from the date of your last close contact. °Isolate immediately and get tested if symptoms develop. °If more than 90 days have passed since your recovery from infection, follow CDC's recommendations for close contacts. These recommendations will differ depending on your vaccination status. °11/29/2020 °Content source: National Center for Immunization and Respiratory Diseases (NCIRD), Division of Viral Diseases °This information is not intended to replace advice given to you by your health care provider. Make sure you discuss any questions you have with your health care provider. °Document Revised: 04/04/2021 Document Reviewed: 04/04/2021 °Elsevier Patient Education © 2022 [employer] Inc[employer] ° °

## 2022-05-24 ENCOUNTER — Ambulatory Visit (INDEPENDENT_AMBULATORY_CARE_PROVIDER_SITE_OTHER): Payer: 59

## 2022-05-24 ENCOUNTER — Encounter: Payer: Self-pay | Admitting: Emergency Medicine

## 2022-05-24 ENCOUNTER — Ambulatory Visit
Admission: EM | Admit: 2022-05-24 | Discharge: 2022-05-24 | Disposition: A | Discharge status: Home / Self Care | Payer: 59 | Attending: Urgent Care | Admitting: Urgent Care

## 2022-05-24 DIAGNOSIS — E049 Nontoxic goiter, unspecified: Secondary | ICD-10-CM | POA: Diagnosis not present

## 2022-05-24 DIAGNOSIS — R079 Chest pain, unspecified: Secondary | ICD-10-CM

## 2022-05-24 DIAGNOSIS — R0602 Shortness of breath: Secondary | ICD-10-CM

## 2022-05-24 DIAGNOSIS — M940 Chondrocostal junction syndrome [Tietze]: Secondary | ICD-10-CM

## 2022-05-24 DIAGNOSIS — K219 Gastro-esophageal reflux disease without esophagitis: Secondary | ICD-10-CM

## 2022-05-24 DIAGNOSIS — R0789 Other chest pain: Secondary | ICD-10-CM

## 2022-05-24 MED ORDER — LIDOCAINE VISCOUS HCL 2 % MT SOLN
15.0000 mL | Freq: Once | OROMUCOSAL | Status: AC
  Administered 2022-05-24: 15 mL via OROMUCOSAL

## 2022-05-24 MED ORDER — PANTOPRAZOLE SODIUM 40 MG PO TBEC: 40.0000 mg | DELAYED_RELEASE_TABLET | Freq: Every day | ORAL | 0 refills | Status: DC

## 2022-05-24 MED ORDER — PREDNISONE 10 MG (21) PO TBPK: ORAL_TABLET | Freq: Every day | ORAL | 0 refills | Status: DC

## 2022-05-24 MED ORDER — ALUM & MAG HYDROXIDE-SIMETH 200-200-20 MG/5ML PO SUSP
15.0000 mL | Freq: Once | ORAL | Status: AC
  Administered 2022-05-24: 15 mL via ORAL

## 2022-05-24 NOTE — ED Provider Notes (Signed)
[facility]    CSN: [id number] Arrival date & time: 05/24/22  1610      History   Chief Complaint Chief Complaint  Patient presents with   Chest Pain    HPI Jamie Lawson is a 46 y.o. female.   Pleasant 46 year old female presents today due to a 1+ week history of chest pain and shortness of breath.  Patient jokingly states last time she had this similar symptom was when her son graduated high school.  She has 4 teenagers at home that cause her a significant amount of stress.  Patient states over the past week however she has "not felt well".  She reports the pain is primarily over the left side of the chest.  There is mild reproduction with palpation.  Had costochondritis 2 years ago, states that the symptoms feel similar.  She states her left shoulder hurts as well but no radicular symptoms or arm pain. Pt also denies jaw pain. Does have DOE, feels that walking to her car makes her feel short of breath. No palpitations or tachycardia. Pt admits to hx of GERD. Takes OTC meds, and states she "eats Tums like candy." Feels like these OTC meds do not help. Has a recurrent sore throat and feels like her glands are swollen. Admits to feeling fatigued and having trouble losing weight.   Chest Pain   Past Medical History:  Diagnosis Date   Allergy    Sickle cell trait [facility]     Patient Active Problem List   Diagnosis Date Noted   Exposure to COVID-19 virus 07/23/2021   Chest pain 09/07/2020   Travel advice encounter 09/07/2020   COVID-19 07/03/2020   Well adult exam 10/08/2019   Environmental allergies 11/20/2018   Allergic reaction to peanut 11/20/2018   History of allergy to shellfish 11/20/2018   Ganglion cyst of dorsum of right wrist 11/20/2018   Absolute anemia 01/26/2018   Chronic bilateral thoracic back pain 01/26/2018    Past Surgical History:  Procedure Laterality Date   CESAREAN SECTION     x4   CESAREAN SECTION WITH BILATERAL TUBAL LIGATION       OB History     Gravida  4   Para  4   Term      Preterm      AB      Living         SAB      IAB      Ectopic      Multiple      Live Births               Home Medications    Prior to Admission medications   Medication Sig Start Date End Date Taking? Authorizing Provider  albuterol (VENTOLIN HFA) 108 (90 Base) MCG/ACT inhaler Inhale 2 puffs into the lungs every 6 (six) hours as needed for wheezing or shortness of breath. 07/23/21  Yes Breeback, Jade L, PA-C  diphenhydrAMINE (BENADRYL) 50 MG capsule Take 50 mg by mouth every 6 (six) hours as needed for allergies.   Yes [provider]  famotidine (PEPCID) 20 MG tablet Take 1 tablet (20 mg total) by mouth 2 (two) times daily. 11/20/18  Yes Carlis Stable, PA-C  montelukast (SINGULAIR) 10 MG tablet Take one tab by mouth at bedtime 05/11/18  Yes Lattie Haw, MD  pantoprazole (PROTONIX) 40 MG tablet Take 1 tablet (40 mg total) by mouth daily. 05/24/22  Yes Verbena Boeding L, PA  predniSONE (STERAPRED UNI-PAK 21 TAB) 10 MG (21) TBPK tablet Take by mouth daily. Take 6 tabs by mouth daily  for 1 days, then 5 tabs for 1 days, then 4 tabs for 1 days, then 3 tabs for 1 days, 2 tabs for 1 days, then 1 tab by mouth daily for 1 days 05/24/22  Yes Aelyn Stanaland L, PA  atorvastatin (LIPITOR) 20 MG tablet Take 1 tablet (20 mg total) by mouth daily. 09/07/20   Luetta Nutting, DO    Family History Family History  Problem Relation Age of Onset   Healthy Mother    Healthy Father     Social History Social History   Tobacco Use   Smoking status: Never   Smokeless tobacco: Never  Vaping Use   Vaping Use: Never used  Substance Use Topics   Alcohol use: Never   Drug use: Never     Allergies   Patient has no known allergies.   Review of Systems Review of Systems  Cardiovascular:  Positive for chest pain.  As per HPI   Physical Exam Triage Vital Signs ED Triage Vitals  Enc Vitals Group      BP 05/24/22 1636 121/80     Pulse Rate 05/24/22 1636 85     Resp 05/24/22 1636 18     Temp 05/24/22 1636 99.1 F (37.3 C)     Temp Source 05/24/22 1636 Oral     SpO2 05/24/22 1636 97 %     Weight --      Height 05/24/22 1639 5\' 9"  (1.753 m)     Head Circumference --      Peak Flow --      Pain Score 05/24/22 1639 8     Pain Loc --      Pain Edu? --      Excl. in [place]? --    No data found.  Updated Vital Signs BP 121/80 (BP Location: Right Arm)   Pulse 85   Temp 99.1 F (37.3 C) (Oral)   Resp 18   Ht 5\' 9"  (1.753 m)   SpO2 97%   BMI 32.19 kg/m   Visual Acuity Right Eye Distance:   Left Eye Distance:   Bilateral Distance:    Right Eye Near:   Left Eye Near:    Bilateral Near:     Physical Exam Vitals and nursing note reviewed.  Constitutional:      General: She is not in acute distress.    Appearance: She is well-developed. She is obese. She is not ill-appearing, toxic-appearing or diaphoretic.     Comments: Pt smiling upon exam  HENT:     Head: Normocephalic and atraumatic.     Right Ear: Tympanic membrane, ear canal and external ear normal. There is no impacted cerumen.     Left Ear: Tympanic membrane, ear canal and external ear normal. There is no impacted cerumen.     Nose: Nose normal. No congestion or rhinorrhea.     Mouth/Throat:     Mouth: Mucous membranes are moist.     Pharynx: Oropharynx is clear. No oropharyngeal exudate or posterior oropharyngeal erythema.  Eyes:     Extraocular Movements: Extraocular movements intact.     Pupils: Pupils are equal, round, and reactive to light.  Neck:     Thyroid: Thyromegaly present.     Vascular: No hepatojugular reflux.  Cardiovascular:     Rate and Rhythm: Normal rate. No extrasystoles are present.    Chest Wall: PMI is  not displaced.     Heart sounds: Normal heart sounds. Heart sounds not distant. No murmur heard.    No systolic murmur is present.  Pulmonary:     Effort: Pulmonary effort is normal. No  tachypnea, accessory muscle usage or respiratory distress.     Breath sounds: Normal breath sounds. No stridor. No decreased breath sounds, wheezing, rhonchi or rales.  Chest:     Chest wall: Tenderness present. No mass, deformity or crepitus. There is no dullness to percussion.  Abdominal:     General: Bowel sounds are normal. There is no abdominal bruit.     Palpations: Abdomen is soft. There is no hepatomegaly or splenomegaly.     Tenderness: There is no abdominal tenderness.  Musculoskeletal:        General: Normal range of motion.     Cervical back: Normal range of motion and neck supple.     Right lower leg: No edema.     Left lower leg: No edema.  Lymphadenopathy:     Cervical: No cervical adenopathy.  Skin:    General: Skin is warm and dry.     Findings: No erythema.     Nails: There is no clubbing.  Neurological:     General: No focal deficit present.     Mental Status: She is alert and oriented to person, place, and time.     Cranial Nerves: No cranial nerve deficit.  Psychiatric:        Mood and Affect: Mood normal. Mood is not anxious.        Behavior: Behavior normal.      UC Treatments / Results  Labs (all labs ordered are listed, but only abnormal results are displayed) Labs Reviewed  CBC WITH DIFFERENTIAL/PLATELET  COMPLETE METABOLIC PANEL WITH GFR  TSH    EKG   Radiology DG Chest 2 View  Result Date: 05/24/2022 CLINICAL DATA:  Chest pain and shortness of breath EXAM: CHEST - 2 VIEW COMPARISON:  Chest x-ray 05/11/2018 FINDINGS: The heart size and mediastinal contours are within normal limits. Both lungs are clear. The visualized skeletal structures are unremarkable. IMPRESSION: No active cardiopulmonary disease. Electronically Signed   By: Darliss Cheney M.D.   On: 05/24/2022 17:43    Procedures ED EKG  Date/Time: 05/24/2022 5:24 PM  Performed by: Maretta Bees, PA Authorized by: Maretta Bees, PA   Previous ECG:    Previous ECG:  Compared to  current   Similarity:  No change   Comparison ECG info:  Compared to Sep 07, 2020 Interpretation:    Interpretation: non-specific   Quality:    Tracing quality:  Limited by artifact Rate:    ECG rate assessment: normal   Rhythm:    Rhythm: sinus rhythm   Ectopy:    Ectopy: none   QRS:    QRS conduction: normal    (including critical care time)  Medications Ordered in UC Medications  alum & mag hydroxide-simeth (MAALOX/MYLANTA) 200-200-20 MG/5ML suspension 15 mL (15 mLs Oral Given 05/24/22 1744)  lidocaine (XYLOCAINE) 2 % viscous mouth solution 15 mL (15 mLs Mouth/Throat Given 05/24/22 1744)    Initial Impression / Assessment and Plan / UC Course  I have reviewed the triage vital signs and the nursing notes.  Pertinent labs & imaging results that were available during my care of the patient were reviewed by me and considered in my medical decision making (see chart for details).     Atypical chest pain -I do question  if this could possibly be GI related.  Patient's EKG is unchanged from prior.  Chest x-ray also unchanged.  Vital signs stable.  Patient does not smoke.  GI cocktail given in office with limited improvement.  Patient admits symptoms feel similar to when she had costochondritis in the past, prednisone was effective treatment previously.  We will start this, recommended moist heat as well. GERD -possibly secondary to on treated GERD, esophageal spasm could be a differential.  Patient appears to be taking too many Tums.  Warned her against this, could cause elevated calcium.  Will check today.  We will have patient stop all over-the-counter meds, start pantoprazole instead. Costochondritis -as above.  Patient tolerated prednisone previously will restart. Goiter -last TSH was 4 years ago.  We will recheck today.  Patient needs to schedule follow-up with her PCP.   Final Clinical Impressions(s) / UC Diagnoses   Final diagnoses:  Atypical chest pain  Gastroesophageal reflux  disease, unspecified whether esophagitis present  Costochondritis  Goiter     Discharge Instructions      Your EKG and chest x-ray are unremarkable. Your sore throat may be related to your untreated reflux. Please stop taking Tums.  This can cause problems with your calcium. I have called in pantoprazole for you to take in place of Pepcid and Tums.  Take this every morning. Because your symptoms are consistent with the last time you had costochondritis, please use a microwavable heating pack and apply to your chest several times daily.  Please take the prednisone pack as prescribed. I have checked some standard labs for you today.  Please follow-up with your primary care due to your goiter.     ED Prescriptions     Medication Sig Dispense Auth. Provider   pantoprazole (PROTONIX) 40 MG tablet Take 1 tablet (40 mg total) by mouth daily. 30 tablet Sakira Dahmer L, PA   predniSONE (STERAPRED UNI-PAK 21 TAB) 10 MG (21) TBPK tablet Take by mouth daily. Take 6 tabs by mouth daily  for 1 days, then 5 tabs for 1 days, then 4 tabs for 1 days, then 3 tabs for 1 days, 2 tabs for 1 days, then 1 tab by mouth daily for 1 days 21 tablet Chyrel Taha L, PA      PDMP not reviewed this encounter.   Maretta BeesCrain, Ayona Yniguez L, [place] 05/24/22 1814

## 2022-05-24 NOTE — ED Triage Notes (Signed)
Patient c/o generalized chest pain and throat discomfort.  SOB when walking.  No apparent injury.  Denies any OTC pain meds.

## 2022-05-24 NOTE — Discharge Instructions (Addendum)
Your EKG and chest x-ray are unremarkable. Your sore throat may be related to your untreated reflux. Please stop taking Tums.  This can cause problems with your calcium. I have called in pantoprazole for you to take in place of Pepcid and Tums.  Take this every morning. Because your symptoms are consistent with the last time you had costochondritis, please use a microwavable heating pack and apply to your chest several times daily.  Please take the prednisone pack as prescribed. I have checked some standard labs for you today.  Please follow-up with your primary care due to your goiter.

## 2022-05-25 LAB — COMPLETE METABOLIC PANEL WITH GFR
AG Ratio: 1.6 (calc) (ref 1.0–2.5)
ALT: 19 U/L (ref 6–29)
AST: 14 U/L (ref 10–35)
Albumin: 4.4 g/dL (ref 3.6–5.1)
Alkaline phosphatase (APISO): 63 U/L (ref 31–125)
BUN/Creatinine Ratio: 17 (calc) (ref 6–22)
BUN: 18 mg/dL (ref 7–25)
CO2: 24 mmol/L (ref 20–32)
Calcium: 9.9 mg/dL (ref 8.6–10.2)
Chloride: 107 mmol/L (ref 98–110)
Creat: 1.07 mg/dL — ABNORMAL HIGH (ref 0.50–0.99)
Globulin: 2.7 g/dL (calc) (ref 1.9–3.7)
Glucose, Bld: 97 mg/dL (ref 65–99)
Potassium: 4.8 mmol/L (ref 3.5–5.3)
Sodium: 141 mmol/L (ref 135–146)
Total Bilirubin: 0.4 mg/dL (ref 0.2–1.2)
Total Protein: 7.1 g/dL (ref 6.1–8.1)
eGFR: 65 mL/min/{1.73_m2} (ref >=60)

## 2022-05-25 LAB — CBC WITH DIFFERENTIAL/PLATELET
Absolute Monocytes: 573 cells/uL (ref 200–950)
Basophils Absolute: 50 cells/uL (ref 0–200)
Basophils Relative: 0.8 %
Eosinophils Absolute: 82 cells/uL (ref 15–500)
Eosinophils Relative: 1.3 %
HCT: 36 % (ref 35.0–45.0)
Hemoglobin: 11.6 g/dL — ABNORMAL LOW (ref 11.7–15.5)
Lymphs Abs: 2174 cells/uL (ref 850–3900)
MCH: 28 pg (ref 27.0–33.0)
MCHC: 32.2 g/dL (ref 32.0–36.0)
MCV: 86.7 fL (ref 80.0–100.0)
MPV: 11.6 fL (ref 7.5–12.5)
Monocytes Relative: 9.1 %
Neutro Abs: 3421 cells/uL (ref 1500–7800)
Neutrophils Relative %: 54.3 %
Platelets: 304 10*3/uL (ref 140–400)
RBC: 4.15 10*6/uL (ref 3.80–5.10)
RDW: 13.8 % (ref 11.0–15.0)
Total Lymphocyte: 34.5 %
WBC: 6.3 10*3/uL (ref 3.8–10.8)

## 2022-05-25 LAB — TSH: TSH: 1.13 mIU/L

## 2022-06-25 IMAGING — MG DIGITAL SCREENING BILAT W/ TOMO W/ CAD
6 of 10 series · 6 of 30 positions shown · non-contrast
Comparison: None.

CLINICAL DATA: Screening.

EXAM:
DIGITAL SCREENING BILATERAL MAMMOGRAM WITH TOMO AND CAD

[L CC synth-2D]
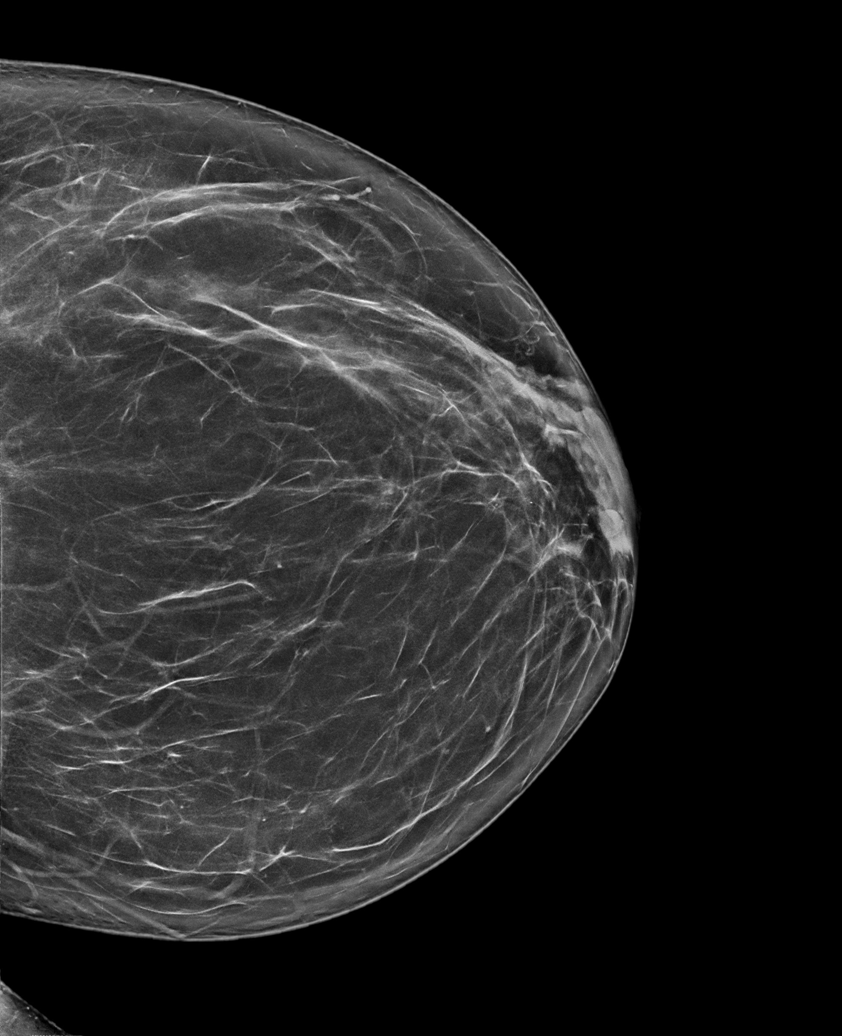

[L MLO synth-2D]
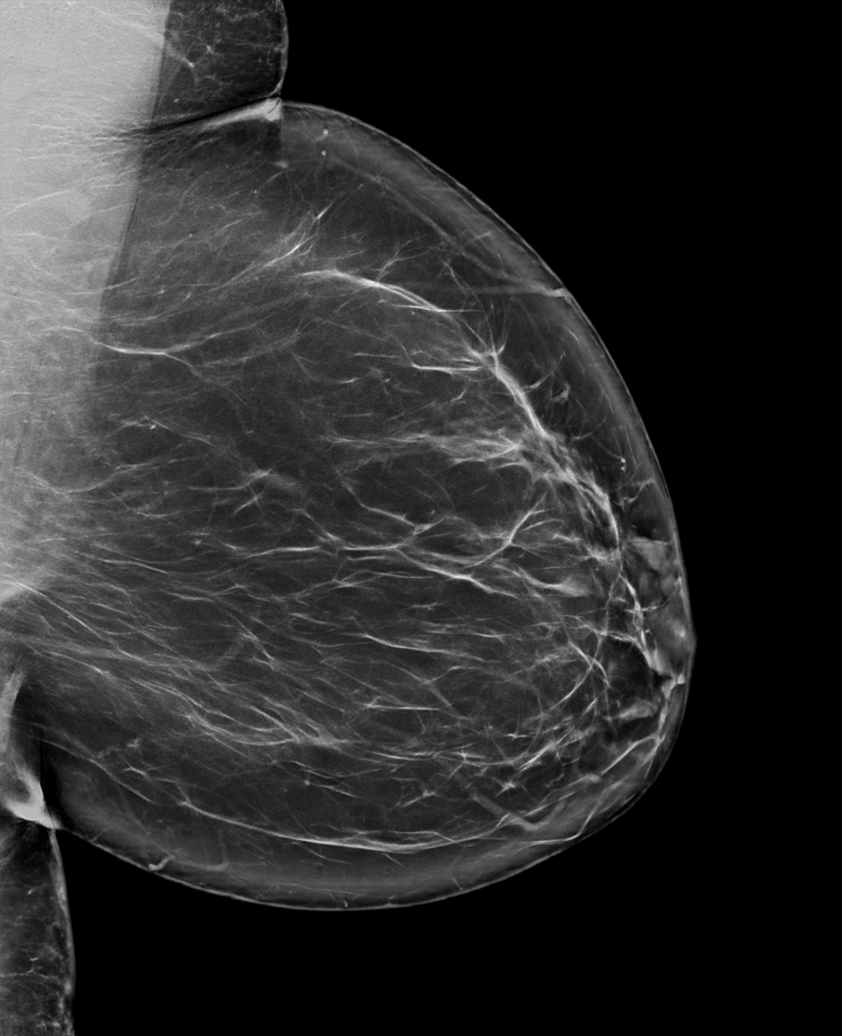

[R XCCL synth-2D]
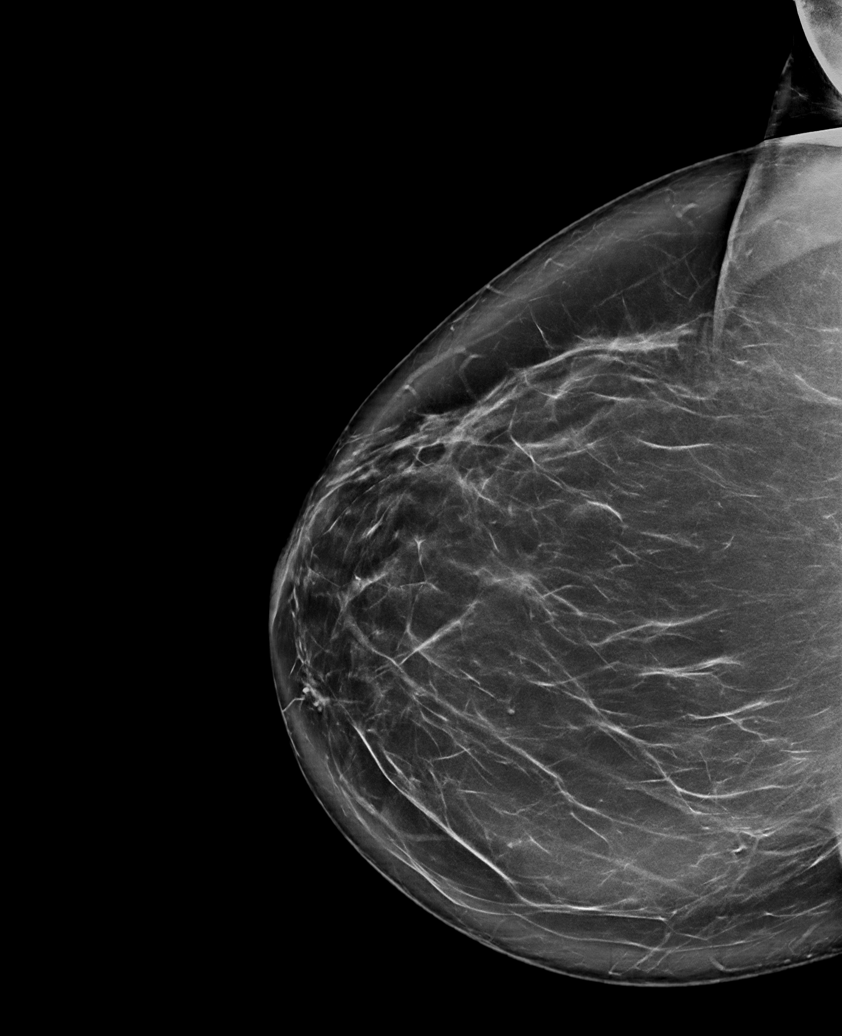

[R CC synth-2D]
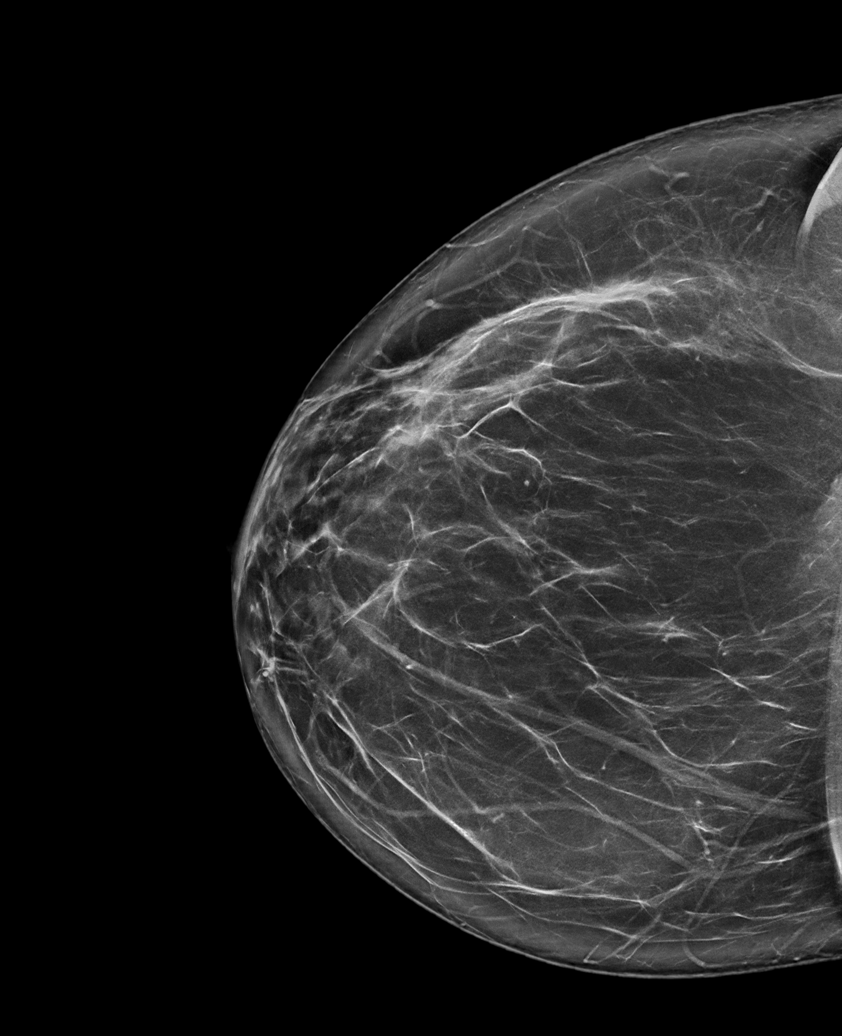

[R MLO synth-2D]
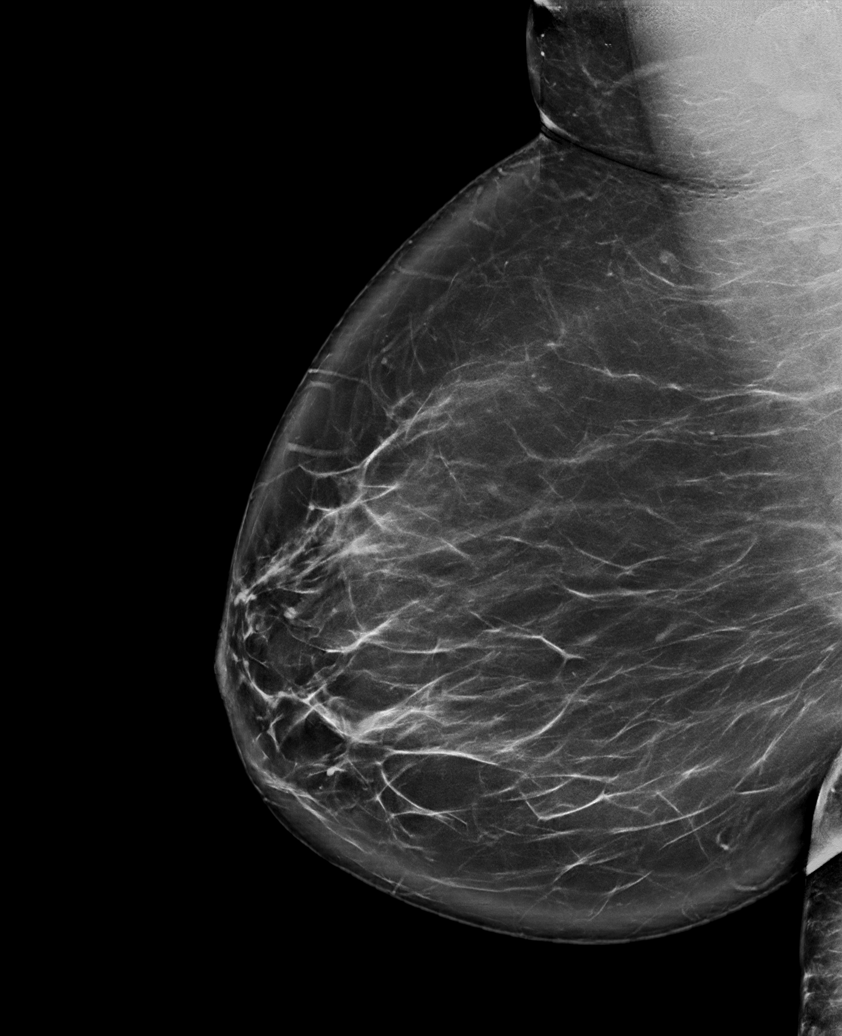

[R XCCL tomo · tomo slice 51/100.0]
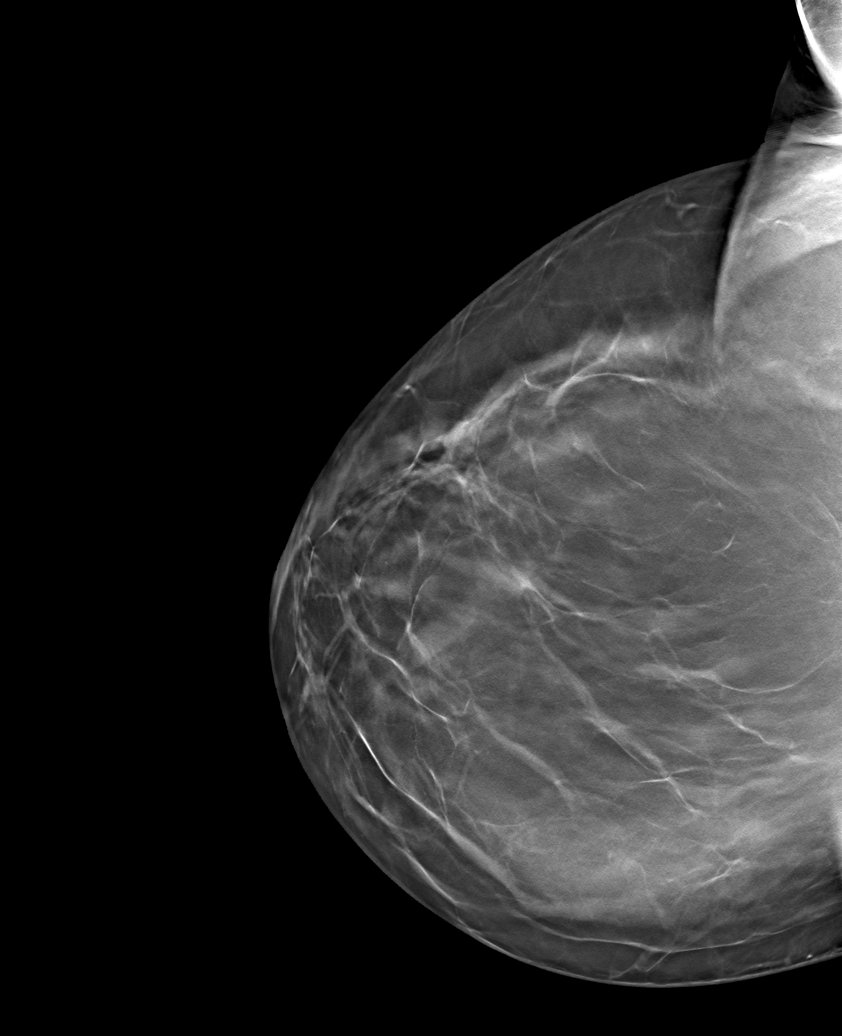

[6 of 30 positions shown; findings below may reference images not displayed]

ACR Breast Density Category b: There are scattered areas of
fibroglandular density.
FINDINGS: There are no findings suspicious for malignancy. Images were
processed with CAD.
IMPRESSION: No mammographic evidence of malignancy. A result letter of this
screening mammogram will be mailed directly to the patient.

RECOMMENDATION:
Screening mammogram in one year. (Code:Y5-G-EJ6)

BI-RADS CATEGORY  1: Negative.

## 2022-12-09 ENCOUNTER — Telehealth: Payer: Self-pay

## 2022-12-09 NOTE — Telephone Encounter (Signed)
Pt lvm stating she needed an inhaler refill. She's about to travel.  Also, at recent OB/GYN visit on 11/26/22 she was made aware of HTN & pre-diabetes status.   Front Office: Please contact the patient and schedule an appt. Pt was last seen 02/23/2021. Unable to refill medication without visit.   Thanks

## 2022-12-10 ENCOUNTER — Ambulatory Visit: Payer: 59 | Admitting: Family Medicine

## 2022-12-10 ENCOUNTER — Encounter: Payer: Self-pay | Admitting: Family Medicine

## 2022-12-10 VITALS — BP 114/76 | HR 72 | Ht 69.0 in | Wt 212.0 lb

## 2022-12-10 DIAGNOSIS — R430 Anosmia: Secondary | ICD-10-CM

## 2022-12-10 DIAGNOSIS — R051 Acute cough: Secondary | ICD-10-CM

## 2022-12-10 DIAGNOSIS — U071 COVID-19: Secondary | ICD-10-CM

## 2022-12-10 DIAGNOSIS — J4520 Mild intermittent asthma, uncomplicated: Secondary | ICD-10-CM | POA: Diagnosis not present

## 2022-12-10 DIAGNOSIS — J45909 Unspecified asthma, uncomplicated: Secondary | ICD-10-CM

## 2022-12-10 HISTORY — DX: Unspecified asthma, uncomplicated: J45.909

## 2022-12-10 MED ORDER — ALBUTEROL SULFATE HFA 108 (90 BASE) MCG/ACT IN AERS: 2.0000 | INHALATION_SPRAY | Freq: Four times a day (QID) | RESPIRATORY_TRACT | 1 refills | Status: DC | PRN

## 2022-12-10 NOTE — Assessment & Plan Note (Signed)
Albuterol HFA renewed to use as needed.

## 2022-12-10 NOTE — Progress Notes (Signed)
Jamie Lawson - 47 y.o. female MRN [id number]  Date of birth: [birth year]  Subjective Chief Complaint  Patient presents with   Allergic Rhinitis     HPI Jamie Lawson is a 47 y.o. female with history of reactive airway disease.  She has used albuterol as needed for bronchospasm.  She is requesting a refill of this.  This does work well for her when she is feeling short of breath.  She is going on a cruise soon and wants to be sure she has this with her.  ROS:  A comprehensive ROS was completed and negative except as noted per HPI  No Known Allergies  Past Medical History:  Diagnosis Date   Allergy    Reactive airway disease 12/10/2022   Sickle cell trait     Past Surgical History:  Procedure Laterality Date   CESAREAN SECTION     x4   CESAREAN SECTION WITH BILATERAL TUBAL LIGATION      Social History   Socioeconomic History   Marital status: Married    Spouse name: Not on file   Number of children: Not on file   Years of education: Not on file   Highest education level: Not on file  Occupational History   Not on file  Tobacco Use   Smoking status: Never   Smokeless tobacco: Never  Vaping Use   Vaping Use: Never used  Substance and Sexual Activity   Alcohol use: Never   Drug use: Never   Sexual activity: Yes    Birth control/protection: Surgical  Other Topics Concern   Not on file  Social History Narrative   Not on file   Social Determinants of Health   Financial Resource Strain: Not on file  Food Insecurity: Not on file  Transportation Needs: Not on file  Physical Activity: Not on file  Stress: Not on file  Social Connections: Not on file    Family History  Problem Relation Age of Onset   Healthy Mother    Healthy Father     Health Maintenance  Topic Date Due   COVID-19 Vaccine (1) Never done   HIV Screening  Never done   Hepatitis C Screening  Never done   DTaP/Tdap/Td (1 - Tdap) Never done   PAP SMEAR-Modifier  03/01/2005    COLONOSCOPY (Pts 45-46yrs Insurance coverage will need to be confirmed)  Never done   MAMMOGRAM  05/04/2021   INFLUENZA VACCINE  04/03/2023   HPV VACCINES  Aged Out     ----------------------------------------------------------------------------------------------------------------------------------------------------------------------------------------------------------------- Physical Exam BP 114/76 (BP Location: Left Arm, Patient Position: Sitting, Cuff Size: Normal)   Pulse 72   Ht 5\' 9"  (1.753 m)   Wt 212 lb (96.2 kg)   SpO2 99%   BMI 31.31 kg/m   Physical Exam Constitutional:      Appearance: Normal appearance.  HENT:     Head: Normocephalic and atraumatic.  Eyes:     General: No scleral icterus. Cardiovascular:     Rate and Rhythm: Normal rate and regular rhythm.  Pulmonary:     Effort: Pulmonary effort is normal.     Breath sounds: Normal breath sounds.  Neurological:     Mental Status: She is alert.  Psychiatric:        Mood and Affect: Mood normal.        Behavior: Behavior normal.     ------------------------------------------------------------------------------------------------------------------------------------------------------------------------------------------------------------------- Assessment and Plan  Reactive airway disease Albuterol HFA renewed to use as needed.   Meds ordered this encounter  Medications   albuterol (VENTOLIN HFA) 108 (90 Base) MCG/ACT inhaler    Sig: Inhale 2 puffs into the lungs every 6 (six) hours as needed for wheezing or shortness of breath.    Dispense:  8 g    Refill:  1    No follow-ups on file.    This visit occurred during the SARS-CoV-2 public health emergency.  Safety protocols were in place, including screening questions prior to the visit, additional usage of staff PPE, and extensive cleaning of exam room while observing appropriate contact time as indicated for disinfecting solutions.

## 2023-01-29 ENCOUNTER — Encounter: Payer: Self-pay | Admitting: Emergency Medicine

## 2023-01-29 ENCOUNTER — Ambulatory Visit (INDEPENDENT_AMBULATORY_CARE_PROVIDER_SITE_OTHER): Payer: 59

## 2023-01-29 ENCOUNTER — Ambulatory Visit
Admission: EM | Admit: 2023-01-29 | Discharge: 2023-01-29 | Disposition: A | Discharge status: Home / Self Care | Payer: 59 | Attending: Family Medicine | Admitting: Family Medicine

## 2023-01-29 DIAGNOSIS — M7989 Other specified soft tissue disorders: Secondary | ICD-10-CM | POA: Diagnosis not present

## 2023-01-29 DIAGNOSIS — L03011 Cellulitis of right finger: Secondary | ICD-10-CM

## 2023-01-29 DIAGNOSIS — M79644 Pain in right finger(s): Secondary | ICD-10-CM

## 2023-01-29 MED ORDER — CEPHALEXIN 500 MG PO CAPS: 1000.0000 mg | ORAL_CAPSULE | Freq: Two times a day (BID) | ORAL | 0 refills | Status: AC

## 2023-01-29 NOTE — ED Triage Notes (Signed)
Patient c/o right middle finger swelling x 3 days that radiates up hand.  No injury.  Patient has taken Ibuprofen and Tylenol for pain.

## 2023-01-29 NOTE — Discharge Instructions (Addendum)
Patient advised of middle finger x-ray result with hardcopy provided.  Advised patient to take medication as directed with food to completion.  Encouraged increase daily water intake to 64 ounces per day while taking these medication.  Advised patient if symptoms worsen and/or unresolved please follow-up with PCP or [facility] for further evaluation.

## 2023-01-29 NOTE — ED Provider Notes (Signed)
[facility]    CSN: [id number] Arrival date & time: 01/29/23  1856      History   Chief Complaint Chief Complaint  Patient presents with   Right middle finger swelling    HPI Jamie Lawson is a 47 y.o. female.   HPI  Past Medical History:  Diagnosis Date   Allergy    Reactive airway disease 12/10/2022   Sickle cell trait [facility])     Patient Active Problem List   Diagnosis Date Noted   Reactive airway disease 12/10/2022   Exposure to COVID-19 virus 07/23/2021   Chest pain 09/07/2020   Travel advice encounter 09/07/2020   COVID-19 07/03/2020   Well adult exam 10/08/2019   Environmental allergies 11/20/2018   Allergic reaction to peanut 11/20/2018   History of allergy to shellfish 11/20/2018   Ganglion cyst of dorsum of right wrist 11/20/2018   Absolute anemia 01/26/2018   Chronic bilateral thoracic back pain 01/26/2018    Past Surgical History:  Procedure Laterality Date   CESAREAN SECTION     x4   CESAREAN SECTION WITH BILATERAL TUBAL LIGATION      OB History     Gravida  4   Para  4   Term      Preterm      AB      Living         SAB      IAB      Ectopic      Multiple      Live Births               Home Medications    Prior to Admission medications   Medication Sig Start Date End Date Taking? Authorizing Provider  cephALEXin (KEFLEX) 500 MG capsule Take 2 capsules (1,000 mg total) by mouth 2 (two) times daily for 7 days. 01/29/23 02/05/23 Yes Trevor Iha, FNP  albuterol (VENTOLIN HFA) 108 (90 Base) MCG/ACT inhaler Inhale 2 puffs into the lungs every 6 (six) hours as needed for wheezing or shortness of breath. 12/10/22   Everrett Coombe, DO  diphenhydrAMINE (BENADRYL) 50 MG capsule Take 50 mg by mouth every 6 (six) hours as needed for allergies.    [provider]    Family History Family History  Problem Relation Age of Onset   Healthy Mother    Healthy Father     Social History Social History    Tobacco Use   Smoking status: Never   Smokeless tobacco: Never  Vaping Use   Vaping Use: Never used  Substance Use Topics   Alcohol use: Never   Drug use: Never     Allergies   Patient has no known allergies.   Review of Systems Review of Systems   Physical Exam Triage Vital Signs ED Triage Vitals  Enc Vitals Group     BP      Pulse      Resp      Temp      Temp src      SpO2      Weight      Height      Head Circumference      Peak Flow      Pain Score      Pain Loc      Pain Edu?      Excl. in GC?    No data found.  Updated Vital Signs BP 117/84 (BP Location: Left Arm)   Pulse 74  Temp 98.4 F (36.9 C) (Oral)   Resp 18   Ht 5\' 7"  (1.702 m)   Wt 215 lb (97.5 kg)   LMP 01/22/2023   SpO2 97%   BMI 33.67 kg/m     Physical Exam Vitals and nursing note reviewed.  Constitutional:      Appearance: Normal appearance. She is normal weight.  HENT:     Head: Normocephalic and atraumatic.     Mouth/Throat:     Mouth: Mucous membranes are moist.     Pharynx: Oropharynx is clear.  Eyes:     Extraocular Movements: Extraocular movements intact.     Conjunctiva/sclera: Conjunctivae normal.     Pupils: Pupils are equal, round, and reactive to light.  Cardiovascular:     Rate and Rhythm: Normal rate and regular rhythm.     Pulses: Normal pulses.  Pulmonary:     Effort: Pulmonary effort is normal.     Breath sounds: Normal breath sounds. No wheezing or rhonchi.  Musculoskeletal:        General: Normal range of motion.     Cervical back: Normal range of motion and neck supple.  Skin:    General: Skin is warm and dry.     Comments: Right middle finger over DIP: Erythematous with mild soft tissue swelling-please see image below  Neurological:     General: No focal deficit present.     Mental Status: She is alert and oriented to person, place, and time. Mental status is at baseline.      UC Treatments / Results  Labs (all labs ordered are listed,  but only abnormal results are displayed) Labs Reviewed - No data to display  EKG   Radiology DG Finger Middle Right  Result Date: 01/29/2023 CLINICAL DATA:  Right middle finger pain, swelling EXAM: RIGHT MIDDLE FINGER 2+V COMPARISON:  None Available. FINDINGS: There is no evidence of fracture or dislocation. There is no evidence of arthropathy or other focal bone abnormality. Soft tissues are unremarkable. IMPRESSION: Negative. Electronically Signed   By: Charlett Nose M.D.   On: 01/29/2023 20:05    Procedures Procedures (including critical care time)  Medications Ordered in UC Medications - No data to display  Initial Impression / Assessment and Plan / UC Course  I have reviewed the triage vital signs and the nursing notes.  Pertinent labs & imaging results that were available during my care of the patient were reviewed by me and considered in my medical decision making (see chart for details).     MDM: 1.  Pain of right middle finger-right middle finger x-ray revealed above; 2.  Paronychia of right middle finger-Rx Keflex 1 g twice daily x 7 days. Patient advised of middle finger x-ray result with hardcopy provided.  Advised patient to take medication as directed with food to completion.  Encouraged increase daily water intake to 64 ounces per day while taking these medication.  Advised patient if symptoms worsen and/or unresolved please follow-up with PCP or [place] [facility] for further evaluation.  Final Clinical Impressions(s) / UC Diagnoses   Final diagnoses:  Pain of right middle finger  Paronychia of right middle finger     Discharge Instructions      Patient advised of middle finger x-ray result with hardcopy provided.  Advised patient to take medication as directed with food to completion.  Encouraged increase daily water intake to 64 ounces per day while taking these medication.  Advised patient if symptoms worsen and/or unresolved please follow-up  with PCP or  [place] [facility] for further evaluation.     ED Prescriptions     Medication Sig Dispense Auth. Provider   cephALEXin (KEFLEX) 500 MG capsule Take 2 capsules (1,000 mg total) by mouth 2 (two) times daily for 7 days. 28 capsule Trevor Iha, FNP      PDMP not reviewed this encounter.   Trevor Iha, FNP 01/29/23 2021

## 2023-09-12 ENCOUNTER — Telehealth: Payer: Self-pay

## 2023-09-12 NOTE — Telephone Encounter (Signed)
 Pls contact pt to schedule appt with Dr. Alvia concerning travel meds. Thanks   Copied from CRM [id number]. Topic: Clinical - Medication Refill >> Sep 12, 2023 10:08 AM Joesph PARAS wrote: Most Recent Primary Care Visit:  Provider: ALVIA BRING  Department: [facility]  Visit Type: OFFICE VISIT  Date: 12/10/2022  Medication: Malaria Prevention Pills  Has the patient contacted their pharmacy? No  Is this the correct pharmacy for this prescription? Yes If no, delete pharmacy and type the correct one.  This is the patient's preferred pharmacy:  [facility] [phone] - [place], [place] - [address] [place] [address] Phone: [phone] Fax: [phone]    Has the prescription been filled recently? No - Is on a case by case basis for trips to [place]  Is the patient out of the medication? Yes  Has the patient been seen for an appointment in the last year OR does the patient have an upcoming appointment? Yes  Can we respond through MyChart? Yes  Agent: Please be advised that Rx refills may take up to 3 business days. We ask that you follow-up with your pharmacy.

## 2023-09-23 ENCOUNTER — Ambulatory Visit: Payer: 59 | Admitting: Physician Assistant

## 2023-09-23 ENCOUNTER — Encounter: Payer: Self-pay | Admitting: Physician Assistant

## 2023-09-23 VITALS — BP 126/74 | HR 74 | Ht 67.0 in | Wt 216.0 lb

## 2023-09-23 DIAGNOSIS — J4520 Mild intermittent asthma, uncomplicated: Secondary | ICD-10-CM | POA: Diagnosis not present

## 2023-09-23 DIAGNOSIS — Z7184 Encounter for health counseling related to travel: Secondary | ICD-10-CM

## 2023-09-23 MED ORDER — PREDNISONE 20 MG PO TABS: 20.0000 mg | ORAL_TABLET | Freq: Two times a day (BID) | ORAL | 0 refills | Status: AC

## 2023-09-23 MED ORDER — ATOVAQUONE-PROGUANIL HCL 250-100 MG PO TABS: ORAL_TABLET | ORAL | 0 refills | Status: AC

## 2023-09-23 MED ORDER — AZITHROMYCIN 250 MG PO TABS: ORAL_TABLET | ORAL | 0 refills | Status: AC

## 2023-09-23 MED ORDER — ALBUTEROL SULFATE HFA 108 (90 BASE) MCG/ACT IN AERS: 2.0000 | INHALATION_SPRAY | Freq: Four times a day (QID) | RESPIRATORY_TRACT | 1 refills | Status: AC | PRN

## 2023-09-23 NOTE — Progress Notes (Signed)
Established Patient Office Visit  Subjective   Patient ID: Jamie Lawson, female    DOB: November 04, 1975  Age: 48 y.o. MRN: [id number]  Chief Complaint  Patient presents with   Immunizations     travel medications - malaria prescription    HPI Pt is a 48 yo female who presents to the clinic for medication refill before travel to [place] for 3 weeks. She would like medication for malaria and antibiotic if she were to get sick. She does have reactive airway disease and easily gets bronchitis.   Patient Active Problem List   Diagnosis Date Noted   Reactive airway disease 12/10/2022   Chest pain 09/07/2020   Travel advice encounter 09/07/2020   Well adult exam 10/08/2019   Environmental allergies 11/20/2018   Allergic reaction to peanut 11/20/2018   History of allergy to shellfish 11/20/2018   Ganglion cyst of dorsum of right wrist 11/20/2018   Absolute anemia 01/26/2018   Chronic bilateral thoracic back pain 01/26/2018   Past Medical History:  Diagnosis Date   Allergy    Reactive airway disease 12/10/2022   Sickle cell trait (HCC)    Past Surgical History:  Procedure Laterality Date   CESAREAN SECTION     x4   CESAREAN SECTION WITH BILATERAL TUBAL LIGATION     Family History  Problem Relation Age of Onset   Healthy Mother    Healthy Father    No Known Allergies    Review of Systems  All other systems reviewed and are negative.     Objective:     BP 126/74   Pulse 74   Ht 5\' 7"  (1.702 m)   Wt 216 lb (98 kg)   SpO2 99%   BMI 33.83 kg/m  BP Readings from Last 3 Encounters:  09/23/23 126/74  01/29/23 117/84  12/10/22 114/76   Wt Readings from Last 3 Encounters:  09/23/23 216 lb (98 kg)  01/29/23 215 lb (97.5 kg)  12/10/22 212 lb (96.2 kg)      Physical Exam Constitutional:      Appearance: Normal appearance.  HENT:     Head: Normocephalic.  Cardiovascular:     Rate and Rhythm: Normal rate.  Pulmonary:     Effort: Pulmonary effort is  normal.     Breath sounds: Normal breath sounds.  Neurological:     General: No focal deficit present.     Mental Status: She is alert and oriented to person, place, and time.  Psychiatric:        Mood and Affect: Mood normal.         Assessment & Plan:  Marland KitchenMarland KitchenAryn was seen today for immunizations.  Diagnoses and all orders for this visit:  Travel advice encounter -     albuterol (VENTOLIN HFA) 108 (90 Base) MCG/ACT inhaler; Inhale 2 puffs into the lungs every 6 (six) hours as needed for wheezing or shortness of breath. -     atovaquone-proguanil (MALARONE) 250-100 MG TABS tablet; Start 1 day prior to travel.  Take daily and continue for 1 additional week upon return. -     predniSONE (DELTASONE) 20 MG tablet; Take 1 tablet (20 mg total) by mouth 2 (two) times daily with a meal for 5 days. -     azithromycin (ZITHROMAX Z-PAK) 250 MG tablet; Take 2 tablets (500 mg) on  Day 1,  followed by 1 tablet (250 mg) once daily on Days 2 through 5.  Mild intermittent reactive airway disease without complication -  albuterol (VENTOLIN HFA) 108 (90 Base) MCG/ACT inhaler; Inhale 2 puffs into the lungs every 6 (six) hours as needed for wheezing or shortness of breath. -     predniSONE (DELTASONE) 20 MG tablet; Take 1 tablet (20 mg total) by mouth 2 (two) times daily with a meal for 5 days.   Discussed use of zpak and prednisone Albuterol refilled Malarone for malaria prevention Declined flu, Tdap, and covid vaccination Follow up as needed   Tandy Gaw, PA-C

## 2023-09-25 ENCOUNTER — Ambulatory Visit: Payer: 59 | Admitting: Family Medicine
